# Patient Record
Sex: Female | Born: 1935 | Race: White | Hispanic: No | Smoking: Never smoker
Health system: Southern US, Community
[De-identification: ages and names within clinical notes are randomized; demographics above are authoritative.]

## PROBLEM LIST (undated history)

## (undated) DIAGNOSIS — E669 Obesity, unspecified: Secondary | ICD-10-CM

## (undated) DIAGNOSIS — I351 Nonrheumatic aortic (valve) insufficiency: Secondary | ICD-10-CM

## (undated) DIAGNOSIS — T50905A Adverse effect of unspecified drugs, medicaments and biological substances, initial encounter: Secondary | ICD-10-CM

## (undated) DIAGNOSIS — M109 Gout, unspecified: Secondary | ICD-10-CM

## (undated) DIAGNOSIS — I350 Nonrheumatic aortic (valve) stenosis: Secondary | ICD-10-CM

## (undated) DIAGNOSIS — I509 Heart failure, unspecified: Secondary | ICD-10-CM

## (undated) DIAGNOSIS — K635 Polyp of colon: Secondary | ICD-10-CM

## (undated) DIAGNOSIS — J4540 Moderate persistent asthma, uncomplicated: Secondary | ICD-10-CM

## (undated) DIAGNOSIS — J449 Chronic obstructive pulmonary disease, unspecified: Secondary | ICD-10-CM

## (undated) DIAGNOSIS — I10 Essential (primary) hypertension: Secondary | ICD-10-CM

## (undated) DIAGNOSIS — I4891 Unspecified atrial fibrillation: Secondary | ICD-10-CM

## (undated) DIAGNOSIS — I493 Ventricular premature depolarization: Secondary | ICD-10-CM

## (undated) DIAGNOSIS — J9610 Chronic respiratory failure, unspecified whether with hypoxia or hypercapnia: Secondary | ICD-10-CM

## (undated) DIAGNOSIS — D649 Anemia, unspecified: Secondary | ICD-10-CM

## (undated) DIAGNOSIS — F329 Major depressive disorder, single episode, unspecified: Secondary | ICD-10-CM

## (undated) DIAGNOSIS — G8929 Other chronic pain: Secondary | ICD-10-CM

## (undated) DIAGNOSIS — R269 Unspecified abnormalities of gait and mobility: Secondary | ICD-10-CM

## (undated) DIAGNOSIS — I5032 Chronic diastolic (congestive) heart failure: Secondary | ICD-10-CM

## (undated) DIAGNOSIS — K5730 Diverticulosis of large intestine without perforation or abscess without bleeding: Secondary | ICD-10-CM

## (undated) DIAGNOSIS — Z780 Asymptomatic menopausal state: Secondary | ICD-10-CM

## (undated) DIAGNOSIS — J90 Pleural effusion, not elsewhere classified: Secondary | ICD-10-CM

## (undated) DIAGNOSIS — M205X9 Other deformities of toe(s) (acquired), unspecified foot: Secondary | ICD-10-CM

## (undated) DIAGNOSIS — E782 Mixed hyperlipidemia: Secondary | ICD-10-CM

## (undated) DIAGNOSIS — F5101 Primary insomnia: Secondary | ICD-10-CM

## (undated) DIAGNOSIS — Z79899 Other long term (current) drug therapy: Secondary | ICD-10-CM

## (undated) DIAGNOSIS — J984 Other disorders of lung: Secondary | ICD-10-CM

## (undated) DIAGNOSIS — I1310 Hypertensive heart and chronic kidney disease without heart failure, with stage 1 through stage 4 chronic kidney disease, or unspecified chronic kidney disease: Secondary | ICD-10-CM

## (undated) DIAGNOSIS — Q2730 Arteriovenous malformation, site unspecified: Secondary | ICD-10-CM

## (undated) DIAGNOSIS — G4733 Obstructive sleep apnea (adult) (pediatric): Secondary | ICD-10-CM

## (undated) DIAGNOSIS — R0600 Dyspnea, unspecified: Secondary | ICD-10-CM

## (undated) DIAGNOSIS — M545 Low back pain: Secondary | ICD-10-CM

## (undated) DIAGNOSIS — J181 Lobar pneumonia, unspecified organism: Secondary | ICD-10-CM

## (undated) HISTORY — DX: Moderate persistent asthma, uncomplicated: J45.40

## (undated) HISTORY — DX: Heart failure, unspecified: I50.9

## (undated) HISTORY — DX: Other long term (current) drug therapy: Z79.899

## (undated) HISTORY — PX: TONSILLECTOMY: SUR1361

## (undated) HISTORY — DX: Chronic diastolic (congestive) heart failure: I50.32

## (undated) HISTORY — PX: VAGINAL HYSTERECTOMY: SUR661

## (undated) HISTORY — DX: Major depressive disorder, single episode, unspecified: F32.9

## (undated) HISTORY — DX: Gout, unspecified: M10.9

## (undated) HISTORY — DX: Other deformities of toe(s) (acquired), unspecified foot: M20.5X9

## (undated) HISTORY — DX: Mixed hyperlipidemia: E78.2

## (undated) HISTORY — DX: Hypomagnesemia: E83.42

## (undated) HISTORY — PX: OTHER SURGICAL HISTORY: SHX169

## (undated) HISTORY — DX: Unspecified atrial fibrillation: I48.91

## (undated) HISTORY — DX: Nonrheumatic aortic (valve) insufficiency: I35.1

## (undated) HISTORY — DX: Chronic respiratory failure, unspecified whether with hypoxia or hypercapnia: J96.10

## (undated) HISTORY — DX: Essential (primary) hypertension: I10

## (undated) HISTORY — DX: Other disorders of lung: J98.4

## (undated) HISTORY — DX: Diverticulosis of large intestine without perforation or abscess without bleeding: K57.30

## (undated) HISTORY — DX: Pleural effusion, not elsewhere classified: J90

## (undated) HISTORY — DX: Primary insomnia: F51.01

## (undated) HISTORY — DX: Low back pain: M54.5

## (undated) HISTORY — DX: Lobar pneumonia, unspecified organism: J18.1

## (undated) HISTORY — PX: CATARACT EXTRACTION: SUR2

## (undated) HISTORY — DX: Nonrheumatic aortic (valve) stenosis: I35.0

## (undated) HISTORY — DX: Obstructive sleep apnea (adult) (pediatric): G47.33

## (undated) HISTORY — DX: Other chronic pain: G89.29

## (undated) HISTORY — DX: Polyp of colon: K63.5

## (undated) HISTORY — PX: ABLATION: SHX5711

## (undated) HISTORY — DX: Adverse effect of unspecified drugs, medicaments and biological substances, initial encounter: T50.905A

## (undated) HISTORY — DX: Chronic obstructive pulmonary disease, unspecified: J44.9

## (undated) HISTORY — DX: Ventricular premature depolarization: I49.3

## (undated) HISTORY — DX: Unspecified abnormalities of gait and mobility: R26.9

## (undated) HISTORY — DX: Asymptomatic menopausal state: Z78.0

## (undated) HISTORY — DX: Arteriovenous malformation, site unspecified: Q27.30

## (undated) HISTORY — DX: Anemia, unspecified: D64.9

## (undated) HISTORY — DX: Dyspnea, unspecified: R06.00

## (undated) HISTORY — DX: Obesity, unspecified: E66.9

## (undated) HISTORY — DX: Hypertensive heart and chronic kidney disease without heart failure, with stage 1 through stage 4 chronic kidney disease, or unspecified chronic kidney disease: I13.10

---

## 2014-02-13 ENCOUNTER — Encounter: Payer: Self-pay | Admitting: Critical Care Medicine

## 2014-02-14 ENCOUNTER — Encounter: Payer: Self-pay | Admitting: Critical Care Medicine

## 2014-02-14 ENCOUNTER — Ambulatory Visit (INDEPENDENT_AMBULATORY_CARE_PROVIDER_SITE_OTHER): Payer: Self-pay | Admitting: Critical Care Medicine

## 2014-02-14 VITALS — BP 134/68 | HR 84 | Temp 98.7°F | Ht 64.0 in | Wt 245.0 lb

## 2014-02-14 DIAGNOSIS — J4540 Moderate persistent asthma, uncomplicated: Secondary | ICD-10-CM | POA: Insufficient documentation

## 2014-02-14 DIAGNOSIS — R0600 Dyspnea, unspecified: Secondary | ICD-10-CM

## 2014-02-14 DIAGNOSIS — I130 Hypertensive heart and chronic kidney disease with heart failure and stage 1 through stage 4 chronic kidney disease, or unspecified chronic kidney disease: Secondary | ICD-10-CM | POA: Insufficient documentation

## 2014-02-14 DIAGNOSIS — R0989 Other specified symptoms and signs involving the circulatory and respiratory systems: Secondary | ICD-10-CM

## 2014-02-14 DIAGNOSIS — M109 Gout, unspecified: Secondary | ICD-10-CM | POA: Insufficient documentation

## 2014-02-14 DIAGNOSIS — R0609 Other forms of dyspnea: Secondary | ICD-10-CM

## 2014-02-14 NOTE — Assessment & Plan Note (Signed)
Dyspnea on exertion with interstitial changes by report on CXR 08/2013 and 01/2014.  No other overt factors for interstitial fibrosis, No medication factors Plan Obtain full PFTs Obtain high resolution CT of chest  Obtain Allergy profile, ANA, RA, ESR. Finish course biaxin/prednisone F/u 3 weeks after above completed

## 2014-02-14 NOTE — Patient Instructions (Signed)
Full pulmonary function testing will be obtained A CT chest will be obtained  Finish biaxin and prednisone No other medication changes Return 3 weeks , after studies completed , we will call sooner with results

## 2014-02-14 NOTE — Progress Notes (Signed)
Subjective:    Patient ID: Sandy Hanson, female    DOB: October 21, 1932, 80 y.o.   MRN: [id number] Chief Complaint  Patient presents with  . Pulmonary Consult    Referred by Dr. Rosario Adie -  SOB with activity x 3 wks.  Cough with clear mucus  x few days.    HPI Comments: Dyspnea x 3 weeks. Dry cough. Pt noted progressive DOE going to [employer].  Ok at rest . Symptoms began 3 weeks ago.  08/2013, also had episode of dyspnea and dx bronchitis and Rx aBX   Shortness of Breath This is a new problem. The current episode started more than 1 month ago. The problem occurs daily (only with exertion, no QHS symptoms). The problem has been gradually improving. Associated symptoms include sputum production. Pertinent negatives include no abdominal pain, chest pain, claudication, coryza, ear pain, fever, headaches, hemoptysis, leg pain, leg swelling, orthopnea, PND, rash, rhinorrhea, sore throat, syncope, vomiting or wheezing. The symptoms are aggravated by any activity. Associated symptoms comments: Pt notes a dry cough, and some mucus is clear. Treatments tried: ABX : Biaxin, pred pulse 02/06/14.  The treatment provided significant relief. Her past medical history is significant for pneumonia. There is no history of allergies, aspirin allergies, asthma, bronchiolitis, CAD, chronic lung disease, COPD, DVT, a heart failure, PE or a recent surgery.    Past Medical History  Diagnosis Date  . Gout   . Hypertension   . Dyspnea      Family History  Problem Relation Age of Onset  . Heart disease Maternal Aunt   . Heart disease Paternal Aunt   . Lung cancer Father     non smoker     History   Social History  . Marital Status: N/A    Spouse Name: N/A    Number of Children: N/A  . Years of Education: N/A   Occupational History  . Retired     Pharmacist, hospital   Social History Main Topics  . Smoking status: Never Smoker   . Smokeless tobacco: Never Used  . Alcohol Use: Not on file  . Drug Use: Not on  file  . Sexual Activity: Not on file   Other Topics Concern  . Not on file   Social History Narrative  . No narrative on file     No Known Allergies   No outpatient prescriptions prior to visit.   No facility-administered medications prior to visit.      Review of Systems  Constitutional: Negative for fever.  HENT: Negative for ear pain, nosebleeds, postnasal drip, rhinorrhea, sinus pressure, sore throat, trouble swallowing and voice change.   Respiratory: Positive for cough, sputum production and shortness of breath. Negative for hemoptysis, choking, chest tightness and wheezing.   Cardiovascular: Negative for chest pain, orthopnea, claudication, leg swelling, syncope and PND.  Gastrointestinal: Negative.  Negative for vomiting and abdominal pain.  Skin: Negative for rash.  Neurological: Negative for headaches.       Objective:   Physical Exam Filed Vitals:   02/14/14 1601  BP: 134/68  Pulse: 84  Temp: 98.7 F (37.1 C)  TempSrc: Oral  Height: '5\' 4"'  (1.626 m)  Weight: 245 lb (111.131 kg)  SpO2: 91%    Gen: Pleasant, obese , in no distress,  normal affect  ENT: No lesions,  mouth clear,  oropharynx clear, no postnasal drip  Neck: No JVD, no TMG, no carotid bruits  Lungs: No use of accessory muscles, no dullness to percussion, exp  wheeze R mid lung zone.  Cardiovascular: RRR, heart sounds normal, no murmur or gallops, no peripheral edema  Abdomen: soft and NT, no HSM,  BS normal  Musculoskeletal: No deformities, no cyanosis or clubbing  Neuro: alert, non focal  Skin: Warm, no lesions or rashes  No results found.  amb saturations:89-92% with heavy ambulation  CXR from 08/2013 and 01/2014 reviewed     Assessment & Plan:   Dyspnea Dyspnea on exertion with interstitial changes by report on CXR 08/2013 and 01/2014.  No other overt factors for interstitial fibrosis, No medication factors Plan Obtain full PFTs Obtain high resolution CT of chest  Obtain  Allergy profile, ANA, RA, ESR. Finish course biaxin/prednisone F/u 3 weeks after above completed   Updated Medication List Outpatient Encounter Prescriptions as of 02/14/2014  Medication Sig  . allopurinol (ZYLOPRIM) 100 MG tablet Take 1 tablet by mouth daily.  Marland Kitchen amLODipine (NORVASC) 5 MG tablet Take 1 tablet by mouth daily.  . clarithromycin (BIAXIN) 500 MG tablet Take 1 tablet by mouth 2 (two) times daily.  . hydrochlorothiazide (HYDRODIURIL) 25 MG tablet Take 1 tablet by mouth daily.  . potassium chloride (K-DUR,KLOR-CON) 10 MEQ tablet Take 1 tablet by mouth daily.  . predniSONE (DELTASONE) 10 MG tablet take 6 x 4 days, 4  X 4 days, 2 x 4 days

## 2014-02-17 ENCOUNTER — Telehealth: Payer: Self-pay | Admitting: Critical Care Medicine

## 2014-02-17 LAB — PULMONARY FUNCTION TEST

## 2014-02-17 MED ORDER — AMOXICILLIN-POT CLAVULANATE 875-125 MG PO TABS: 1.0000 | ORAL_TABLET | Freq: Two times a day (BID) | ORAL | Status: DC

## 2014-02-17 NOTE — Telephone Encounter (Signed)
Pt returned call

## 2014-02-17 NOTE — Telephone Encounter (Signed)
Pt was seen by PW in [place] on 02/14/14. He would like to see her back in 3 wks. Pt was let go before appt was scheduled. lmomtcb for pt to schedule 3 wk follow up with PW in [place].

## 2014-02-17 NOTE — Telephone Encounter (Signed)
Per OV 02/14/14; Patient Instructions      Full pulmonary function testing will be obtained A CT chest will be obtained   Finish biaxin and prednisone No other medication changes Return 3 weeks , after studies completed , we will call sooner with results  ---  Called spoke with pt. She finished ABX. Pt c/o prod cough w/ clear-light green phlem. Since Wed she has been coughing a lot. She is blowing out clear phlem from nose. She had 1 night of wheezing but none since last OV w/ PW 01/1514. Denies any chest tx. Pt requesting recs. Please advise Dr. Joya Gaskins thanks.   No Known Allergies

## 2014-02-17 NOTE — Telephone Encounter (Signed)
Pt aware of recs. Rx sent in.  She scheduled appt in [place] for 03/28/14 at 10:45 d/t her going out of town for over 1 week. Will forward to [place] as an [place]

## 2014-02-17 NOTE — Telephone Encounter (Signed)
Call in augmentin 875mg  bid x 7 days

## 2014-02-17 NOTE — Telephone Encounter (Signed)
Appt scheduled. See phone msg from 6/19 for additional information.

## 2014-02-21 ENCOUNTER — Encounter: Payer: Self-pay | Admitting: Critical Care Medicine

## 2014-02-21 ENCOUNTER — Telehealth: Payer: Self-pay | Admitting: Critical Care Medicine

## 2014-02-21 DIAGNOSIS — R0600 Dyspnea, unspecified: Secondary | ICD-10-CM

## 2014-02-21 NOTE — Telephone Encounter (Signed)
Called, spoke with pt.  Informed her of lab results per Dr. Joya Gaskins.  She verbalized understanding.  Pt states she had PFT done at [facility] on Friday and is requesting results.    [facility], spoke with Medical Records.  They will fax results to [facility] office today.

## 2014-02-21 NOTE — Telephone Encounter (Signed)
Call pt and tell her all of her labs are normal No allergies, no autoimmune problem as a cause of lung problems

## 2014-02-21 NOTE — Telephone Encounter (Signed)
Breathing test shows severe obstruction and restriction I advise:  More prednisone 10mg  Take 4 for four days 3 for four days 2 for four days 1 for four days #40 Start symbicort two puff twice daily When will pt get CT Chest done??

## 2014-02-21 NOTE — Telephone Encounter (Signed)
PFT results from 02/17/14 received and placed in PW's folder. Dr. Joya Gaskins, pls advise.   Thank you.

## 2014-02-22 MED ORDER — BUDESONIDE-FORMOTEROL FUMARATE 160-4.5 MCG/ACT IN AERO: 2.0000 | INHALATION_SPRAY | Freq: Two times a day (BID) | RESPIRATORY_TRACT | Status: DC

## 2014-02-22 MED ORDER — PREDNISONE 10 MG PO TABS: ORAL_TABLET | ORAL | Status: DC

## 2014-02-22 NOTE — Telephone Encounter (Signed)
Called, spoke with pt.  Informed her of below PFT results and recs per Dr. Joya Gaskins.  She verbalized understanding and would like rxs sent to [facility].  This has been done -- pt is aware and is to call back if she has any problems or concerns.  Pt states she has not yet been scheduled for CT Chest.  This order was placed on 02/14/14 during [place].  Pt would like to have this done at [facility].  [facility], will you please schedule this and let pt know.  Also, please let me know so I can follow up on results.  Thank you.

## 2014-02-23 NOTE — Telephone Encounter (Signed)
appt 03/02/14@[place]  [facility] @2 :30pm pt is aware Joellen [place]

## 2014-03-01 ENCOUNTER — Telehealth: Payer: Self-pay | Admitting: Critical Care Medicine

## 2014-03-01 ENCOUNTER — Encounter: Payer: Self-pay | Admitting: Critical Care Medicine

## 2014-03-01 NOTE — Telephone Encounter (Signed)
Order printed and faxed to [facility] for CT scheduled 03/02/14 Faxed to (F) [phone] Spoke to [place] with [facility]-- verified received order  Nothing further needed.

## 2014-03-07 NOTE — Telephone Encounter (Signed)
Pt had CT Chest done at [facility] on 03/02/14. Results have been placed in PWs to do to address when he returns to office next wk. Pt aware and would like [place] to call [phone] next wk with results as she will be out of town. Will route to PW's box and to my box for follow up.

## 2014-03-12 NOTE — Telephone Encounter (Signed)
CT Chest was Completely NORMAL.  No pulmonary fibrosis seen

## 2014-03-13 NOTE — Telephone Encounter (Signed)
lmomtcb for pt 

## 2014-03-14 NOTE — Telephone Encounter (Signed)
lmomtcb for pt 

## 2014-03-15 ENCOUNTER — Telehealth: Payer: Self-pay | Admitting: Critical Care Medicine

## 2014-03-15 NOTE — Telephone Encounter (Signed)
Called and spoke to pt. Informed pt of results of CT chest per PW and to continue to follow PW's OV recs. Pt verbalized understanding and denied any further concerns at this time.

## 2014-03-15 NOTE — Telephone Encounter (Signed)
Called and spoke to pt. Informed pt of results of CT chest per PW and to continue to follow PW's OV recs. Pt verbalized understanding and denied any further concerns at this time.    Elsie Stain, MD at 03/12/2014 4:06 PM     Status: Signed        CT Chest was Completely NORMAL. No pulmonary fibrosis seen

## 2014-03-15 NOTE — Telephone Encounter (Signed)
lmomtcb for pt 

## 2014-03-20 ENCOUNTER — Telehealth: Payer: Self-pay | Admitting: Critical Care Medicine

## 2014-03-20 MED ORDER — AZITHROMYCIN 250 MG PO TABS: ORAL_TABLET | ORAL | Status: DC

## 2014-03-20 MED ORDER — PREDNISONE 10 MG PO TABS: ORAL_TABLET | ORAL | Status: DC

## 2014-03-20 NOTE — Telephone Encounter (Signed)
Pt returned call

## 2014-03-20 NOTE — Telephone Encounter (Signed)
lmtcb on number listed in message

## 2014-03-20 NOTE — Telephone Encounter (Signed)
Prednisone 10mg  and zpak have been sent to [facility]  Pt aware.  Nothing further needed.

## 2014-03-20 NOTE — Telephone Encounter (Signed)
Called, spoke with pt -  Is currently at [facility].  C/o cough with increased mucus production since Friday.  Mucus is clear, white, to yellow.  Has increased SOB in the evenings, some chest tightness, and a little wheezing.  No f/c/s.  Is taking symbicort bid.  Has not tried anything otc for symptoms.  Requesting further recs.  Dr. Joya Gaskins, pls advise. Thank you.  Has a pending OV with PW on July 28 in [place] [address] [facility] - Phone # above

## 2014-03-20 NOTE — Telephone Encounter (Signed)
She will need called in:    Prednisone 10mg  Take 4 tablets daily for 5 days then stop #20 Azithromycin 250mg  Take two once then one daily until gone #6  Keep 7/28 appt

## 2014-03-28 ENCOUNTER — Encounter: Payer: Self-pay | Admitting: Critical Care Medicine

## 2014-03-28 ENCOUNTER — Ambulatory Visit (INDEPENDENT_AMBULATORY_CARE_PROVIDER_SITE_OTHER): Payer: Medicare PPO | Admitting: Critical Care Medicine

## 2014-03-28 VITALS — BP 102/64 | HR 93 | Temp 98.3°F | Ht 64.0 in | Wt 243.2 lb

## 2014-03-28 DIAGNOSIS — J45901 Unspecified asthma with (acute) exacerbation: Secondary | ICD-10-CM

## 2014-03-28 DIAGNOSIS — J4541 Moderate persistent asthma with (acute) exacerbation: Secondary | ICD-10-CM

## 2014-03-28 DIAGNOSIS — R0600 Dyspnea, unspecified: Secondary | ICD-10-CM

## 2014-03-28 MED ORDER — COMPRESSOR/NEBULIZER MISC
Status: DC
Start: 2014-03-28 — End: 2017-04-21

## 2014-03-28 MED ORDER — METHYLPREDNISOLONE ACETATE 80 MG/ML IJ SUSP
120.0000 mg | Freq: Once | INTRAMUSCULAR | Status: AC
  Administered 2014-03-28: 120 mg via INTRAMUSCULAR

## 2014-03-28 MED ORDER — ALBUTEROL SULFATE (2.5 MG/3ML) 0.083% IN NEBU: 2.5000 mg | INHALATION_SOLUTION | Freq: Four times a day (QID) | RESPIRATORY_TRACT | Status: DC | PRN

## 2014-03-28 MED ORDER — FLUTICASONE PROPIONATE 50 MCG/ACT NA SUSP
2.0000 | Freq: Every day | NASAL | Status: DC
Start: 2014-03-28 — End: 2014-04-04

## 2014-03-28 NOTE — Progress Notes (Signed)
   Subjective:    Patient ID: Sandy Hanson, female    DOB: [birth year], 80 y.o.   MRN: [id number]  HPI Comments: Dyspnea x 3 weeks. Dry cough. Pt noted progressive DOE going to [employer].  Ok at rest . Symptoms began 3 weeks ago.  08/2013, also had episode of dyspnea and dx bronchitis and Rx aBX   03/28/2014 Chief Complaint  Patient presents with  . Follow-up    c/o sob worse x 2-3 wks. with exertion,wheezing, cough-clear,white yellow,green; midchest tightness in am occass.,staying inside b/c of humidity,no energy,hot a lot  Obtain full PFTs Obtain high resolution CT of chest  Obtain Allergy profile, ANA, RA, ESR. Finish course biaxin/prednisone  Pt on symbicort twice daily.          Review of Systems  HENT: Negative for nosebleeds, postnasal drip, sinus pressure, trouble swallowing and voice change.   Respiratory: Positive for cough. Negative for choking and chest tightness.   Gastrointestinal: Negative.        Objective:   Physical Exam  Filed Vitals:   03/28/14 1110  BP: 102/64  Pulse: 93  Temp: 98.3 F (36.8 C)  TempSrc: Oral  Height: $Remove'5\' 4"'[id number]$  (1.626 m)  Weight: 243 lb 3.2 oz (110.315 kg)  SpO2: 92%    Gen: Pleasant, obese , in no distress,  normal affect  ENT: No lesions,  mouth clear,  oropharynx clear, no postnasal drip  Neck: No JVD, no TMG, no carotid bruits  Lungs: No use of accessory muscles, no dullness to percussion, exp wheeze R mid lung zone.  Cardiovascular: RRR, heart sounds normal, no murmur or gallops, no peripheral edema  Abdomen: soft and NT, no HSM,  BS normal  Musculoskeletal: No deformities, no cyanosis or clubbing  Neuro: alert, non focal  Skin: Warm, no lesions or rashes  No results found.  amb saturations:89-92% with heavy ambulation  CXR from 08/2013 and 01/2014 reviewed     Assessment & Plan:   No problem-specific assessment & plan notes found for this encounter.   Updated Medication List Outpatient Encounter  Prescriptions as of 03/28/2014  Medication Sig  . allopurinol (ZYLOPRIM) 100 MG tablet Take 1 tablet by mouth daily.  Marland Kitchen amLODipine (NORVASC) 5 MG tablet Take 1 tablet by mouth daily.  . budesonide-formoterol (SYMBICORT) 160-4.5 MCG/ACT inhaler Inhale 2 puffs into the lungs 2 (two) times daily.  . hydrochlorothiazide (HYDRODIURIL) 25 MG tablet Take 1 tablet by mouth daily.  . potassium chloride (K-DUR,KLOR-CON) 10 MEQ tablet Take 1 tablet by mouth daily.  . [DISCONTINUED] amoxicillin-clavulanate (AUGMENTIN) 875-125 MG per tablet Take 1 tablet by mouth 2 (two) times daily.  . [DISCONTINUED] azithromycin (ZITHROMAX) 250 MG tablet Take 2 tabs (together) on day one, then one daily until gone.  . [DISCONTINUED] clarithromycin (BIAXIN) 500 MG tablet Take 1 tablet by mouth 2 (two) times daily.  . [DISCONTINUED] predniSONE (DELTASONE) 10 MG tablet take 6 x 4 days, 4  X 4 days, 2 x 4 days  . [DISCONTINUED] predniSONE (DELTASONE) 10 MG tablet Take 4 for four days 3 for four days 2 for four days 1 for four days  . [DISCONTINUED] predniSONE (DELTASONE) 10 MG tablet Take 4 tabs daily x 5 days

## 2014-03-28 NOTE — Patient Instructions (Signed)
Return one week in [place] office A Depomedrol injection was given 120mg   USe spacer with symbicort A nebulizer with albuterol was obtained Start fluticasone two sprays ea nostril daily

## 2014-03-28 NOTE — Assessment & Plan Note (Signed)
Moderate persistent asthma with significant lower airway obstruction Poor response to bronchodilator due to very poor HFA technique Plan A Depomedrol injection was given 120mg   USe spacer with symbicort A nebulizer with albuterol was obtained Start fluticasone two sprays ea nostril daily The patient was reinstructed as to proper HFA technique

## 2014-03-30 ENCOUNTER — Encounter: Payer: Self-pay | Admitting: Critical Care Medicine

## 2014-04-04 ENCOUNTER — Telehealth: Payer: Self-pay | Admitting: Critical Care Medicine

## 2014-04-04 ENCOUNTER — Encounter: Payer: Self-pay | Admitting: Critical Care Medicine

## 2014-04-04 ENCOUNTER — Ambulatory Visit (INDEPENDENT_AMBULATORY_CARE_PROVIDER_SITE_OTHER): Payer: Medicare PPO | Admitting: Critical Care Medicine

## 2014-04-04 VITALS — BP 118/68 | HR 120 | Temp 97.9°F | Ht 64.0 in | Wt 240.6 lb

## 2014-04-04 DIAGNOSIS — J4541 Moderate persistent asthma with (acute) exacerbation: Secondary | ICD-10-CM

## 2014-04-04 DIAGNOSIS — J019 Acute sinusitis, unspecified: Secondary | ICD-10-CM | POA: Insufficient documentation

## 2014-04-04 DIAGNOSIS — J45901 Unspecified asthma with (acute) exacerbation: Secondary | ICD-10-CM

## 2014-04-04 DIAGNOSIS — J0191 Acute recurrent sinusitis, unspecified: Secondary | ICD-10-CM

## 2014-04-04 MED ORDER — BUDESONIDE 0.25 MG/2ML IN SUSP: 0.2500 mg | Freq: Two times a day (BID) | RESPIRATORY_TRACT | Status: DC

## 2014-04-04 MED ORDER — ARFORMOTEROL TARTRATE 15 MCG/2ML IN NEBU: 15.0000 ug | INHALATION_SOLUTION | Freq: Two times a day (BID) | RESPIRATORY_TRACT | Status: DC

## 2014-04-04 MED ORDER — AMOXICILLIN-POT CLAVULANATE 875-125 MG PO TABS: 1.0000 | ORAL_TABLET | Freq: Two times a day (BID) | ORAL | Status: DC

## 2014-04-04 MED ORDER — FLUTICASONE PROPIONATE 50 MCG/ACT NA SUSP: 2.0000 | Freq: Two times a day (BID) | NASAL | Status: DC

## 2014-04-04 NOTE — Patient Instructions (Signed)
Start Brovana and budesonide in nebulizer twice daily Stop symbicort Increase flonase two puff ea nostril twice daily Take augmentin twice daily for 7days Albuterol in nebulizer as needed Use saline rinse in both nostrils twice daily Return 1 month

## 2014-04-04 NOTE — Progress Notes (Signed)
Subjective:    Patient ID: Sandy Hanson, female    DOB: October 18, 1932, 80 y.o.   MRN: [id number]  HPI 04/04/2014 Chief Complaint  Patient presents with  . Follow-up    still has sob but better now,blowing nose a lot-clear,cough-green,white,occass. wheezing,midchest tightness,,using neb. 1-2 day  FPt is still coughing up mucus.  Pt still feels like is choking. Blows out of nose .  Mucus is green , and occ foamy white No real sinus pressure.  The patient has a spacer device with the [facility] Symbicort however upon demonstration still has very poor overall technique    Review of Systems Constitutional:   No  weight loss, night sweats,  Fevers, chills, fatigue, lassitude. HEENT:   No headaches,  Difficulty swallowing,  Tooth/dental problems,  Sore throat,                No sneezing, itching, ear ache, ++nasal congestion, +++post nasal drip,   CV:  No chest pain,  Orthopnea, PND, swelling in lower extremities, anasarca, dizziness, palpitations  GI  No heartburn, indigestion, abdominal pain, nausea, vomiting, diarrhea, change in bowel habits, loss of appetite  Resp: Notes shortness of breath with exertion and at rest.  Notes  excess mucus, notes  productive cough,  No non-productive cough,  No coughing up of blood.  Notes  change in color of mucus.  No wheezing.  No chest wall deformity  Skin: no rash or lesions.  GU: no dysuria, change in color of urine, no urgency or frequency.  No flank pain.  MS:  No joint pain or swelling.  No decreased range of motion.  No back pain.  Psych:  No change in mood or affect. No depression or anxiety.  No memory loss.     Objective:   Physical Exam Filed Vitals:   04/04/14 1059  BP: 118/68  Pulse: 120  Temp: 97.9 F (36.6 C)  TempSrc: Oral  Height: 5\' 4"  (1.626 m)  Weight: 240 lb 9.6 oz (109.135 kg)  SpO2: 93%    Gen: Pleasant, well-nourished, in no distress,  normal affect  ENT: No lesions,  mouth clear,  oropharynx clear, purulent right  nares  Neck: No JVD, no TMG, no carotid bruits  Lungs: No use of accessory muscles, no dullness to percussion, inspiratory expiratory wheezes with poor airflow  Cardiovascular: RRR, heart sounds normal, no murmur or gallops, no peripheral edema  Abdomen: soft and NT, no HSM,  BS normal  Musculoskeletal: No deformities, no cyanosis or clubbing  Neuro: alert, non focal  Skin: Warm, no lesions or rashes  No results found.      Assessment & Plan:   Asthma, moderate persistent Moderate persistent asthma with very poor HFA technique even with spacer device Ongoing symptom complex due to inability to deliver medication the lungs because of technique issue Mild acute sinusitis Plan Start Brovana and budesonide in nebulizer twice daily Stop symbicort Increase flonase two puff ea nostril twice daily Take augmentin twice daily for 7days Albuterol in nebulizer as needed Use saline rinse in both nostrils twice daily Return 1 month    Updated Medication List Outpatient Encounter Prescriptions as of 04/04/2014  Medication Sig  . albuterol (PROVENTIL) (2.5 MG/3ML) 0.083% nebulizer solution Take 3 mLs (2.5 mg total) by nebulization every 6 (six) hours as needed for wheezing or shortness of breath.  . allopurinol (ZYLOPRIM) 100 MG tablet Take 1 tablet by mouth daily.  Marland Kitchen amLODipine (NORVASC) 5 MG tablet Take 1 tablet by mouth daily.  Marland Kitchen  fluticasone (FLONASE) 50 MCG/ACT nasal spray Place 2 sprays into both nostrils 2 (two) times daily.  . hydrochlorothiazide (HYDRODIURIL) 25 MG tablet Take 1 tablet by mouth daily.  . Nebulizers (COMPRESSOR/NEBULIZER) MISC Use with albuterol  . potassium chloride (K-DUR,KLOR-CON) 10 MEQ tablet Take 1 tablet by mouth daily.  . [DISCONTINUED] budesonide-formoterol (SYMBICORT) 160-4.5 MCG/ACT inhaler Inhale 2 puffs into the lungs 2 (two) times daily.  . [DISCONTINUED] fluticasone (FLONASE) 50 MCG/ACT nasal spray Place 2 sprays into both nostrils daily.  Marland Kitchen  amoxicillin-clavulanate (AUGMENTIN) 875-125 MG per tablet Take 1 tablet by mouth 2 (two) times daily.  Marland Kitchen arformoterol (BROVANA) 15 MCG/2ML NEBU Take 2 mLs (15 mcg total) by nebulization 2 (two) times daily.  . budesonide (PULMICORT) 0.25 MG/2ML nebulizer solution Take 2 mLs (0.25 mg total) by nebulization 2 (two) times daily.

## 2014-04-04 NOTE — Telephone Encounter (Signed)
Pt was started on Brovana and budesonide nebs today during OV.  Called [facility] in [place], spoke with Rowley. Was advised because pt has HMO [employer], she is one of the few neb meds will need to go through PART D NOT PART B.   Budesonide is going through insurance. Garlon Hatchet is needing a PA through PART D. Ulice Dash will fax this form to triage.  Pt has tried albuterol neb - did not help. She is not capable of using any HFA device.   Will await fax to initiate PA.

## 2014-04-04 NOTE — Assessment & Plan Note (Signed)
Moderate persistent asthma with very poor HFA technique even with spacer device Ongoing symptom complex due to inability to deliver medication the lungs because of technique issue Mild acute sinusitis Plan Start Brovana and budesonide in nebulizer twice daily Stop symbicort Increase flonase two puff ea nostril twice daily Take augmentin twice daily for 7days Albuterol in nebulizer as needed Use saline rinse in both nostrils twice daily Return 1 month

## 2014-04-05 NOTE — Telephone Encounter (Signed)
Seen by PW in [place] 04/04/14--started on Brovana with Budesonide in Nebulizer Took nebulizers today at 10:30 --  Mixed the two together -- shortly after taking nebulizers pt states she became very nauseated. Pt states that she has been throwing up since 12pm today--pt states that she feel very sick, having a lot of dry heaving and yellow bile when vomiting.  Pt wanting to know if mixing the two medications above could have caused her current symptoms.  No drug allergies that she is aware of. No Known Allergies  Pt aware that since it is late in the day that we are going to ask our Doc of the Day Dr Gwenette Greet for recommendations.  Please advise Dr Gwenette Greet. Thanks.

## 2014-04-05 NOTE — Telephone Encounter (Signed)
Pt is aware of below. Please advise PW thanks

## 2014-04-05 NOTE — Telephone Encounter (Signed)
I am not aware of any reaction that would keep her from using the two together.  Nevertheless, she should avoid mixing them until we can send a message to Dr. Joya Gaskins and get his input about mixing. Please forward this to him.

## 2014-04-05 NOTE — Telephone Encounter (Signed)
Patient states she is been throwing up.  She thinks this is due to medications.  Asking to speak to nurse.

## 2014-04-06 NOTE — Telephone Encounter (Signed)
Pt states that she took her Budesonide and Brovana together last night and had NO nausea/vomiting Pt states that she has given it some thought and states that she remembers taking her Augmentin on a fairly empty stomach yesterday AM is almost convinced that this is what caused her GI upset. Advised the patient of rec's per Dr Joya Gaskins below and that patient expressed understanding stating that she may try to continue taking the nebs together seeing that she did not have any GI issues. Advised the patient to contact our office if anything further needed.

## 2014-04-06 NOTE — Telephone Encounter (Signed)
Try HOLDing budesonide for now Use brovana ALONE bid If no nausea then in the future add budesonide back but DO NOT MIX Meds again

## 2014-04-07 ENCOUNTER — Telehealth: Payer: Self-pay | Admitting: *Deleted

## 2014-04-07 NOTE — Telephone Encounter (Signed)
Called [phone] Had to use the pts SS# since her insurance info was not scanned into her chart.   Waiting on fax to be sent to triage fax #.

## 2014-04-07 NOTE — Telephone Encounter (Signed)
Form received and placed in PW look at to sign.  Placed on crystals desk to have PW sign this.  Will forward to crystal to follow up on.

## 2014-04-07 NOTE — Telephone Encounter (Signed)
See phone msg from 04/07/14 for additional information regarding Brovana PA.

## 2014-04-07 NOTE — Telephone Encounter (Signed)
Garlon Hatchet PA signed by Dr. Joya Gaskins and faxed to [facility] at [phone].  Will await approval/denial.

## 2014-04-13 ENCOUNTER — Ambulatory Visit (INDEPENDENT_AMBULATORY_CARE_PROVIDER_SITE_OTHER): Payer: Medicare PPO | Admitting: Adult Health

## 2014-04-13 ENCOUNTER — Telehealth: Payer: Self-pay | Admitting: Critical Care Medicine

## 2014-04-13 ENCOUNTER — Encounter: Payer: Self-pay | Admitting: Adult Health

## 2014-04-13 VITALS — BP 120/66 | HR 68 | Temp 98.5°F | Ht 64.0 in | Wt 241.4 lb

## 2014-04-13 DIAGNOSIS — J45901 Unspecified asthma with (acute) exacerbation: Secondary | ICD-10-CM

## 2014-04-13 DIAGNOSIS — I10 Essential (primary) hypertension: Secondary | ICD-10-CM

## 2014-04-13 DIAGNOSIS — T50905A Adverse effect of unspecified drugs, medicaments and biological substances, initial encounter: Secondary | ICD-10-CM

## 2014-04-13 DIAGNOSIS — J4541 Moderate persistent asthma with (acute) exacerbation: Secondary | ICD-10-CM

## 2014-04-13 HISTORY — DX: Adverse effect of unspecified drugs, medicaments and biological substances, initial encounter: T50.905A

## 2014-04-13 NOTE — Telephone Encounter (Signed)
i called [employer] and they did fax over form stating that part d has denied coverage but part b will cover this medication.  [facility] is out of stock and this will be back in tomorrow.  They will run this through for the pt tomorrow.  Will sign off of this message.

## 2014-04-13 NOTE — Assessment & Plan Note (Signed)
Recent flare now resolving  Mild allergic reaction to PCN -appearing to be resolving  Plan  Zyrtec 10mg  daily for 5 days -at bedtime  Pepcid 20mg  daily for 5 days -at bedtime  Continue on Brovana and Pulmicort neb  Please contact office for sooner follow up if symptoms do not improve or worsen or seek emergency care  follow up Dr. Joya Gaskins  As planned and As needed

## 2014-04-13 NOTE — Telephone Encounter (Signed)
Called spoke with pt. appt scheduled to see TP today. Nothing further needed 

## 2014-04-13 NOTE — Progress Notes (Signed)
   Subjective:    Patient ID: Sandy Hanson, female    DOB: 23-Mar-1933, 80 y.o.   MRN: [id number]  HPI 80 yo female with known hx of moderate persistent asthma   04/13/2014 Acute OV  Complains of possible med reaction with hives appearing yesterday morning, itching. Hives along torso, arms, legs  Finished Augmentin 8/11 PM, took nebs 8/12 AM, flonase this AM.  Has never had a PCN reaction in past.  Denies any increased SOB, wheezing, tongue swelling, difficulty swallowing. Has pruritis on/off.  No chest pain, orthopnea, edema .  Cough, congestion and wheezing are much better.       Review of Systems Constitutional:   No  weight loss, night sweats,  Fevers, chills,  +fatigue, or  lassitude.  HEENT:   No headaches,  Difficulty swallowing,  Tooth/dental problems, or  Sore throat,                No sneezing, itching, ear ache, nasal congestion, post nasal drip,   CV:  No chest pain,  Orthopnea, PND, swelling in lower extremities, anasarca, dizziness, palpitations, syncope.   GI  No heartburn, indigestion, abdominal pain, nausea, vomiting, diarrhea, change in bowel habits, loss of appetite, bloody stools.   Resp:    No chest wall deformity  Skin: +hives.  GU: no dysuria, change in color of urine, no urgency or frequency.  No flank pain, no hematuria   MS:  No joint pain or swelling.  No decreased range of motion.  No back pain.  Psych:  No change in mood or affect. No depression or anxiety.  No memory loss.         Objective:   Physical Exam GEN: A/Ox3; pleasant , NAD, obese , elderly   HEENT:  [place]/AT,  EACs-clear, TMs-wnl, NOSE-clear, THROAT-clear, no lesions, no postnasal drip or exudate noted.   NECK:  Supple w/ fair ROM; no JVD; normal carotid impulses w/o bruits; no thyromegaly or nodules palpated; no lymphadenopathy.  RESP  Clear  P & A; w/o, wheezes/ rales/ or rhonchi.no accessory muscle use, no dullness to percussion  CARD:  RRR, no m/r/g  , no peripheral  edema, pulses intact, no cyanosis or clubbing.  GI:   Soft & nt; nml bowel sounds; no organomegaly or masses detected.  Musco: Warm bil, no deformities or joint swelling noted.   Neuro: alert, no focal deficits noted.    Skin: Warm, few scattered hives along arms and posterior neck .         Assessment & Plan:

## 2014-04-13 NOTE — Patient Instructions (Signed)
Zyrtec 10mg  daily for 5 days -at bedtime  Pepcid 20mg  daily for 5 days -at bedtime  Cool compresses to areas  Avoid excessive sunlight and hot showers until resolved.  Please contact office for sooner follow up if symptoms do not improve or worsen or seek emergency care  List PCN as allergy .  follow up Dr. Joya Gaskins  As planned and As needed

## 2014-04-13 NOTE — Assessment & Plan Note (Signed)
Hives suspected PCN reaction =appears to be mild and resolving  Hold on steroids   Plan  Zyrtec 10mg  daily for 5 days -at bedtime  Pepcid 20mg  daily for 5 days -at bedtime  Cool compresses to areas  Avoid excessive sunlight and hot showers until resolved.  Please contact office for sooner follow up if symptoms do not improve or worsen or seek emergency care  List PCN as allergy .  follow up Dr. Joya Gaskins  As planned and As needed

## 2014-05-09 ENCOUNTER — Encounter: Payer: Self-pay | Admitting: Critical Care Medicine

## 2014-05-09 ENCOUNTER — Ambulatory Visit (INDEPENDENT_AMBULATORY_CARE_PROVIDER_SITE_OTHER): Payer: Medicare PPO | Admitting: Critical Care Medicine

## 2014-05-09 VITALS — BP 118/70 | HR 66 | Temp 97.1°F | Ht 64.0 in | Wt 243.0 lb

## 2014-05-09 DIAGNOSIS — J45909 Unspecified asthma, uncomplicated: Secondary | ICD-10-CM

## 2014-05-09 DIAGNOSIS — J4540 Moderate persistent asthma, uncomplicated: Secondary | ICD-10-CM

## 2014-05-09 NOTE — Progress Notes (Signed)
Subjective:    Patient ID: Sandy Hanson, female    DOB: 06/20/1933, 80 y.o.   MRN: [id number]  HPI  80 y.o.F  female with known hx of moderate persistent asthma   05/09/2014 Chief Complaint  Patient presents with 80  . Follow-up    sob same still gets out of breath easy,rare wheezing,cough-creamy touch of green sm. amt,denies cp or tightness,no fcs,Cardiologist wants to do Echo again   At last ov rx augmentin but pt developed rash Pt never had pcn allergy.   Pt is helped with brovana/budesonide Pt still has some wheezing.  Dyspnea with exertion.  Mucus is creamy, but less than before.  Needs echo per cardiology.   Review of Systems  Constitutional:   No  weight loss, night sweats,  Fevers, chills,  +fatigue, or  lassitude.  HEENT:   No headaches,  Difficulty swallowing,  Tooth/dental problems, or  Sore throat,                No sneezing, itching, ear ache, nasal congestion, post nasal drip,   CV:  No chest pain,  Orthopnea, PND, swelling in lower extremities, anasarca, dizziness, palpitations, syncope.   GI  No heartburn, indigestion, abdominal pain, nausea, vomiting, diarrhea, change in bowel habits, loss of appetite, bloody stools.   Resp:    No chest wall deformity  Skin: +hives.  GU: no dysuria, change in color of urine, no urgency or frequency.  No flank pain, no hematuria   MS:  No joint pain or swelling.  No decreased range of motion.  No back pain.  Psych:  No change in mood or affect. No depression or anxiety.  No memory loss.      Objective:   Physical Exam BP 118/70  Pulse 66  Temp(Src) 97.1 F (36.2 C) (Oral)  Ht 5\' 4"  (1.626 m)  Wt 243 lb (110.224 kg)  BMI 41.69 kg/m2  SpO2 91%  GEN: A/Ox3; pleasant , NAD, obese , elderly   HEENT:  [place]/AT,  EACs-clear, TMs-wnl, NOSE-clear, THROAT-clear, no lesions, no postnasal drip or exudate noted.   NECK:  Supple w/ fair ROM; no JVD; normal carotid impulses w/o bruits; no thyromegaly or nodules palpated; no  lymphadenopathy.  RESP  Clear  P & A; w/o, wheezes/ rales/ or rhonchi.no accessory muscle use, no dullness to percussion  CARD:  RRR, no m/r/g  , no peripheral edema, pulses intact, no cyanosis or clubbing.  GI:   Soft & nt; nml bowel sounds; no organomegaly or masses detected.  Musco: Warm bil, no deformities or joint swelling noted.   Neuro: alert, no focal deficits noted.    Skin: Warm, few scattered hives along arms and posterior neck .      Assessment & Plan:   Asthma, moderate persistent Moderate persistent asthma improved with neb meds.  May be getting a reaction to combining budesonide/brovana Having payment issues with insurance and her neb machine Plan Pt advised to separate budesonide from brovana.  Use brovana first then budesonide second No other medication changes Use albuterol as needed   Updated Medication List Outpatient Encounter Prescriptions as of 05/09/2014  Medication Sig  . albuterol (PROVENTIL) (2.5 MG/3ML) 0.083% nebulizer solution Take 3 mLs (2.5 mg total) by nebulization every 6 (six) hours as needed for wheezing or shortness of breath.  . allopurinol (ZYLOPRIM) 100 MG tablet Take 1 tablet by mouth daily.  Marland Kitchen amLODipine (NORVASC) 5 MG tablet Take 1 tablet by mouth daily.  Marland Kitchen arformoterol (BROVANA) 15 MCG/2ML  NEBU Take 2 mLs (15 mcg total) by nebulization 2 (two) times daily.  . budesonide (PULMICORT) 0.25 MG/2ML nebulizer solution Take 2 mLs (0.25 mg total) by nebulization 2 (two) times daily.  . hydrochlorothiazide (HYDRODIURIL) 25 MG tablet Take 1 tablet by mouth daily.  . Nebulizers (COMPRESSOR/NEBULIZER) MISC Use with albuterol  . potassium chloride (K-DUR,KLOR-CON) 10 MEQ tablet Take 1 tablet by mouth daily.  . fluticasone (FLONASE) 50 MCG/ACT nasal spray Place 2 sprays into both nostrils 2 (two) times daily.

## 2014-05-09 NOTE — Assessment & Plan Note (Signed)
Moderate persistent asthma improved with neb meds.  May be getting a reaction to combining budesonide/brovana Having payment issues with insurance and her neb machine Plan Pt advised to separate budesonide from brovana.  Use brovana first then budesonide second No other medication changes Use albuterol as needed

## 2014-05-09 NOTE — Patient Instructions (Addendum)
Try separating budesonide from brovana.  Use brovana first then budesonide second No other medication changes Use albuterol as needed We will check and dispute the nebulizer coverage issue Return 3 months

## 2014-07-03 ENCOUNTER — Telehealth: Payer: Self-pay | Admitting: Critical Care Medicine

## 2014-07-03 NOTE — Telephone Encounter (Signed)
Called, spoke with pt -  Discussed below recs per PW.  She verbalized understanding and is in agreement to STOP brovana to start on the Sotalol.  Pt aware I will contact pharm to advise of recs as well.  She verbalized understanding and voiced no further questions or concerns at this time.  Called [facility] in [place], spoke with Lavell Luster, Software engineer.  Discussed below with him.  He verbalized understanding of recs per PW and voiced no further questions or concerns at this time.

## 2014-07-03 NOTE — Telephone Encounter (Signed)
She will have to STOP brovana Stay on budesonide , use albuterol as needed in nebulizer

## 2014-07-03 NOTE — Telephone Encounter (Signed)
Called and spoke pt. Pt stated she went to cardiologist, Dr. Minna Merritts,  and found out she has a-fib and is scheduled for a cardioversion on 11/12. Pt was prescribed Sotalol 80mg  BID. Pt went to pick up medication and was told by pharmacist that if pt were to take the Sotalol WITH Brovana then it could cause sudden cardiac arrest. Pharmacist refused to give pt the medication while she is still on Brovana. Pt is requesting recs.   PW please advise.  Allergies  Allergen Reactions  . Augmentin [Amoxicillin-Pot Clavulanate] Hives and Itching

## 2014-08-15 ENCOUNTER — Telehealth: Payer: Self-pay

## 2014-08-15 NOTE — Telephone Encounter (Signed)
Pt needs appointment with PW in January in [place]. Can she be worked in anywhere? Thanks!

## 2014-08-18 NOTE — Telephone Encounter (Signed)
Dr. Bettina Gavia first available [place] appt is in Feb 2016.  Called, spoke with pt.  Offered appt sooner at [facility] office -- pt would like to do this.  Offered Dec appt at [facility].  Pt cannot do this and would like to schedule in Jan 2016.  Appt scheduled for Sep 25, 2014 at 10:45 am at [facility].  Pt confirmed appt date, time, and location and voiced no further questions or concerns at this time.

## 2014-09-25 ENCOUNTER — Ambulatory Visit: Payer: Medicare PPO | Admitting: Critical Care Medicine

## 2014-10-09 ENCOUNTER — Other Ambulatory Visit: Payer: Self-pay | Admitting: Critical Care Medicine

## 2014-10-09 ENCOUNTER — Ambulatory Visit: Payer: Medicare PPO | Admitting: Critical Care Medicine

## 2014-10-23 ENCOUNTER — Encounter: Payer: Self-pay | Admitting: Critical Care Medicine

## 2014-10-23 ENCOUNTER — Ambulatory Visit (INDEPENDENT_AMBULATORY_CARE_PROVIDER_SITE_OTHER): Payer: Medicare PPO | Admitting: Critical Care Medicine

## 2014-10-23 VITALS — BP 100/72 | HR 61 | Temp 98.6°F | Ht 64.0 in | Wt 247.2 lb

## 2014-10-23 DIAGNOSIS — J4540 Moderate persistent asthma, uncomplicated: Secondary | ICD-10-CM

## 2014-10-23 DIAGNOSIS — J449 Chronic obstructive pulmonary disease, unspecified: Secondary | ICD-10-CM

## 2014-10-23 DIAGNOSIS — I4891 Unspecified atrial fibrillation: Secondary | ICD-10-CM

## 2014-10-23 DIAGNOSIS — I480 Paroxysmal atrial fibrillation: Secondary | ICD-10-CM | POA: Insufficient documentation

## 2014-10-23 HISTORY — DX: Unspecified atrial fibrillation: I48.91

## 2014-10-23 NOTE — Assessment & Plan Note (Signed)
Severe persistent asthma with fixed airway obstruction Plan No change in inhaled or maintenance medications. Return in  4 months pulm rehab referral

## 2014-10-23 NOTE — Patient Instructions (Signed)
No change in medications. Return in         4 months [place] rehab referral was made

## 2014-10-23 NOTE — Progress Notes (Signed)
Subjective:    Patient ID: Sandy Hanson, female    DOB: 1932/12/16, 81 y.o.   MRN: [id number]  HPI 81 y.o.F  female with known hx of moderate persistent asthma    10/23/2014 Chief Complaint  Patient presents with  . Follow-up    Patient says she feels a lot better, but she is still not as good as she'd like to be.  SOB.  DOE.  Energy has improved since Cardioinversion.     Since last ov had DCCV.  On brovana/budesonide, asked to separate meds at last ov. This has helped the dyspnea and energy. Pt ok at home , gives out if moves to grocery area. No chest pain.  No wheezing.  Now off brovana d/t cardiac issues.  Still on budesonide  Pt had Afib and 07/13/14.  HR is better.     Review of Systems Constitutional:   No  weight loss, night sweats,  Fevers, chills,  +fatigue, or  lassitude.  HEENT:   No headaches,  Difficulty swallowing,  Tooth/dental problems, or  Sore throat,                No sneezing, itching, ear ache, nasal congestion, post nasal drip,   CV:  No chest pain,  Orthopnea, PND, swelling in lower extremities, anasarca, dizziness, palpitations, syncope.   GI  No heartburn, indigestion, abdominal pain, nausea, vomiting, diarrhea, change in bowel habits, loss of appetite, bloody stools.   Resp:    No chest wall deformity  Skin: +hives.  GU: no dysuria, change in color of urine, no urgency or frequency.  No flank pain, no hematuria   MS:  No joint pain or swelling.  No decreased range of motion.  No back pain.  Psych:  No change in mood or affect. No depression or anxiety.  No memory loss.      Objective:   Physical ExamBP 100/72 mmHg  Pulse 61  Temp(Src) 98.6 F (37 C) (Oral)  Ht 5\' 4"  (1.626 m)  Wt 247 lb 3.2 oz (112.129 kg)  BMI 42.41 kg/m2  SpO2 95%  GEN: A/Ox3; pleasant , NAD, obese , elderly   HEENT:  [place]/AT,  EACs-clear, TMs-wnl, NOSE-clear, THROAT-clear, no lesions, no postnasal drip or exudate noted.   NECK:  Supple w/ fair ROM; no JVD;  normal carotid impulses w/o bruits; no thyromegaly or nodules palpated; no lymphadenopathy.  RESP  Clear  P & A; w/o, wheezes/ rales/ or rhonchi.no accessory muscle use, no dullness to percussion  CARD:  RRR, no m/r/g  , no peripheral edema, pulses intact, no cyanosis or clubbing.  GI:   Soft & nt; nml bowel sounds; no organomegaly or masses detected.  Musco: Warm bil, no deformities or joint swelling noted.   Neuro: alert, no focal deficits noted.    Skin: Warm, few scattered hives along arms and posterior neck .      Assessment & Plan:   Asthma, moderate persistent Severe persistent asthma with fixed airway obstruction Plan No change in inhaled or maintenance medications. Return in  4 months pulm rehab referral     Updated Medication List Outpatient Encounter Prescriptions as of 10/23/2014  Medication Sig  . albuterol (PROVENTIL) (2.5 MG/3ML) 0.083% nebulizer solution Take 3 mLs (2.5 mg total) by nebulization every 6 (six) hours as needed for wheezing or shortness of breath.  . allopurinol (ZYLOPRIM) 100 MG tablet Take 1 tablet by mouth daily.  Marland Kitchen amLODipine (NORVASC) 5 MG tablet Take 1 tablet by mouth daily.  Marland Kitchen  budesonide (PULMICORT) 0.25 MG/2ML nebulizer solution INHALE THE CONTENTS OF ONE VIAL IN NEBULIZER TWICE DAILY  . CARTIA XT 240 MG 24 hr capsule   . ELIQUIS 5 MG TABS tablet   . fluticasone (FLONASE) 50 MCG/ACT nasal spray Place 2 sprays into both nostrils 2 (two) times daily.  . hydrochlorothiazide (HYDRODIURIL) 25 MG tablet Take 1 tablet by mouth daily.  . Nebulizers (COMPRESSOR/NEBULIZER) MISC Use with albuterol  . potassium chloride (K-DUR,KLOR-CON) 10 MEQ tablet Take 1 tablet by mouth daily.  . sotalol (BETAPACE) 80 MG tablet Take 80 mg by mouth 2 (two) times daily.

## 2014-10-31 ENCOUNTER — Telehealth: Payer: Self-pay | Admitting: Critical Care Medicine

## 2014-10-31 NOTE — Telephone Encounter (Signed)
Recent OV notes and PFT faxed to [facility] at [facility] at [place] aware these have been faxed.  Nothing further needed.

## 2014-12-04 ENCOUNTER — Telehealth: Payer: Self-pay | Admitting: Critical Care Medicine

## 2014-12-04 DIAGNOSIS — J4550 Severe persistent asthma, uncomplicated: Secondary | ICD-10-CM

## 2014-12-04 DIAGNOSIS — R0600 Dyspnea, unspecified: Secondary | ICD-10-CM

## 2014-12-04 NOTE — Telephone Encounter (Signed)
Daughter states the patient is getting worse-has days she no energy and breathing gets bad-neb tx's help slightly. Referral for pulmonary to [facility] is what patient and the daughter is wanting. Daughter is aware that I will send message to PW to address in the morning. Daughter wonders if patient should come in to see PW soon or go ahead and get in with [facility].   PW please advise.

## 2014-12-04 NOTE — Telephone Encounter (Signed)
i am ok with referral to asthma/allergy center at [facility] with sooner ROV

## 2014-12-04 NOTE — Telephone Encounter (Signed)
LM for daughter to return call

## 2014-12-05 NOTE — Telephone Encounter (Signed)
Referral has been placed. Nothing further needed.   

## 2014-12-05 NOTE — Telephone Encounter (Signed)
Refer to [place] pulmonary [facility] dr Cresenciano Lick Dx is severe persistent asthma/ dyspnea.

## 2014-12-05 NOTE — Telephone Encounter (Signed)
Spoke with pt's daughter, she is ok with the [facility] referral.   Dr. Joya Gaskins, before I place the referral to [facility], what is her diagnosis for this referral, and do you request a specific provider?  Thanks!

## 2014-12-11 ENCOUNTER — Telehealth: Payer: Self-pay | Admitting: Critical Care Medicine

## 2014-12-11 NOTE — Telephone Encounter (Signed)
Spoke with pt's daughter. She has been scheduled to see TP tomorrow morning at 9am to assess her breathing. Nothing further was needed.

## 2014-12-11 NOTE — Telephone Encounter (Signed)
Pt daughter returning call.Sandy Hanson ° °

## 2014-12-11 NOTE — Telephone Encounter (Signed)
Left message for Sandy Hanson to call back. 

## 2014-12-12 ENCOUNTER — Ambulatory Visit (INDEPENDENT_AMBULATORY_CARE_PROVIDER_SITE_OTHER): Payer: Medicare PPO | Admitting: Adult Health

## 2014-12-12 ENCOUNTER — Encounter: Payer: Self-pay | Admitting: Adult Health

## 2014-12-12 VITALS — BP 100/58 | HR 55 | Temp 98.1°F | Ht 64.0 in | Wt 257.2 lb

## 2014-12-12 DIAGNOSIS — I5021 Acute systolic (congestive) heart failure: Secondary | ICD-10-CM | POA: Diagnosis not present

## 2014-12-12 DIAGNOSIS — I509 Heart failure, unspecified: Secondary | ICD-10-CM | POA: Diagnosis not present

## 2014-12-12 DIAGNOSIS — J4540 Moderate persistent asthma, uncomplicated: Secondary | ICD-10-CM

## 2014-12-12 HISTORY — DX: Heart failure, unspecified: I50.9

## 2014-12-12 NOTE — Assessment & Plan Note (Signed)
Severe Asthma which appears controlled without obvious flare (no wheezing /cough/fever)  Continue on pulmicort  follow up Dr. Joya Gaskins  In 4 weeks .

## 2014-12-12 NOTE — Progress Notes (Signed)
   Subjective:    Patient ID: Sandy Hanson, female    DOB: [birth year], 80 y.o.   MRN: [id number]  HPI 81 yo female never smoker with moderate persistent asthma   12/12/2014 Acute OV  Complains of increased leg swelling, dyspnea, orthopnea, over last 4 weeks.  Shortness of breathing and DOE is getting progressively worse.  She is at the point she can not walk but short distances without wearing out.  She has Atrial Fib w/ cardioversion 07/2014 .  Weight is up 15 lb over last 6 months.  Legs more for swollen up to her knees.  Seen by cardiology 2 weeks ago , given lasix x 3 days with her HCTZ.  Has not helped at all.  She has a loop recorder .  Has ov with cards next week.  Pt denies wheezing , cough , fever or discolored mucus .  She is on pulmicort neb Twice daily  For her asthma She has signifciant airflow obstruction on PFT 2015 with post BD FEV1 44% , sign BD response c/w with asthma dx.  She denies chest pain, palpitation or syncope.  Previously on brovana but off since Nov (contraindication with sotalol. )      Review of Systems Constitutional:   No  weight loss, night sweats,  Fevers, chills, + fatigue, or  lassitude.  HEENT:   No headaches,  Difficulty swallowing,  Tooth/dental problems, or  Sore throat,                No sneezing, itching, ear ache, nasal congestion, post nasal drip,   CV:  No chest pain,  Orthopnea, PND, ++++swelling in lower extremities,  No anasarca, dizziness, palpitations, syncope.   GI  No heartburn, indigestion, abdominal pain, nausea, vomiting, diarrhea, change in bowel habits, loss of appetite, bloody stools.   Resp: ,  No non-productive cough,  No coughing up of blood.  No change in color of mucus.  No wheezing.  No chest wall deformity  Skin: no rash or lesions.  GU: no dysuria, change in color of urine, no urgency or frequency.  No flank pain, no hematuria   MS:  No joint pain or swelling.  No decreased range of motion.  No back  pain.  Psych:  No change in mood or affect. No depression or anxiety.  No memory loss.         Objective:   Physical Exam GEN: A/Ox3; pleasant , morbidly obese in wheelchair   HEENT:  [place]/AT,  EACs-clear, TMs-wnl, NOSE-clear, THROAT-clear, no lesions, no postnasal drip or exudate noted.   NECK:  Supple w/ fair ROM; no JVD; normal carotid impulses w/o bruits; no thyromegaly or nodules palpated; no lymphadenopathy.  RESP  Bibasilar crackles .no accessory muscle use, no dullness to percussion  CARD:  RRR, no m/r/g  , 2-3+  peripheral edema, pulses intact, no cyanosis or clubbing.  GI:   Soft & nt; nml bowel sounds; no organomegaly or masses detected.  Musco: Warm bil, no deformities or joint swelling noted.   Neuro: alert, no focal deficits noted.    Skin: Warm, no lesions or rashes         Assessment & Plan:

## 2014-12-12 NOTE — Assessment & Plan Note (Signed)
Appears to be in decompensated CHF with (15+ wt gain, leg swelling, crackles, orthopnea)  Discussed admission to hospital however pt wants to go to [facility] as her cardiolgist is there.  [facility] cardiology, spoke with Humberto Seals .  Pt to go to [facility] emergency department for evaluation Please contact office for sooner follow up if symptoms do not improve or worsen or seek emergency care  '

## 2014-12-12 NOTE — Patient Instructions (Signed)
We discussed with [facility] , Debra RN for you to go  To [facility] ER  for evaluation for possible CHF (fluid overload) .  Continue on Pulmicort Neb Twice daily   Follow up Dr. Joya Gaskins  In 4 weeks and As needed   Please contact office for sooner follow up if symptoms do not improve or worsen or seek emergency care

## 2014-12-21 ENCOUNTER — Telehealth: Payer: Self-pay | Admitting: Critical Care Medicine

## 2014-12-21 NOTE — Telephone Encounter (Signed)
Patient's daughter says that her mom has been in the hospital for the past 10 days and may be released in the next couple of days.  Will be in rehabilitation for a while.  She is going to need a BiPap and may need to get a sleep study in order to obtain a CPAP/ Bipap to take home.  She has been using BiPAP every since she has been in the hospital.  Daughter requesting that physician review hospital records and let her know if she needs to get sleep study and if so, can we order the study so patient can have CPAP/BiPAP before she returns home.   To Dr. Annamaria Boots in Dr. Bettina Gavia absence.  Current Outpatient Prescriptions on File Prior to Visit  Medication Sig Dispense Refill  . albuterol (PROVENTIL) (2.5 MG/3ML) 0.083% nebulizer solution Take 3 mLs (2.5 mg total) by nebulization every 6 (six) hours as needed for wheezing or shortness of breath. 120 mL 12  . allopurinol (ZYLOPRIM) 100 MG tablet Take 1 tablet by mouth daily.    Marland Kitchen amLODipine (NORVASC) 5 MG tablet Take 1 tablet by mouth daily.    . budesonide (PULMICORT) 0.25 MG/2ML nebulizer solution INHALE THE CONTENTS OF ONE VIAL IN NEBULIZER TWICE DAILY 60 mL 5  . CARTIA XT 240 MG 24 hr capsule daily.   5  . ELIQUIS 5 MG TABS tablet 2 (two) times daily.   5  . fluticasone (FLONASE) 50 MCG/ACT nasal spray Place 2 sprays into both nostrils 2 (two) times daily. 16 g 6  . furosemide (LASIX) 20 MG tablet Take 20 mg by mouth daily as needed.    . hydrochlorothiazide (HYDRODIURIL) 25 MG tablet Take 1 tablet by mouth daily.    . Nebulizers (COMPRESSOR/NEBULIZER) MISC Use with albuterol 1 each 0  . potassium chloride (K-DUR,KLOR-CON) 10 MEQ tablet Take 1 tablet by mouth daily.    . sotalol (BETAPACE) 80 MG tablet Take 80 mg by mouth 2 (two) times daily.     No current facility-administered medications on file prior to visit.   Allergies  Allergen Reactions  . Augmentin [Amoxicillin-Pot Clavulanate] Hives and Itching

## 2014-12-21 NOTE — Telephone Encounter (Signed)
Patient's daughter notified.  Will call back to schedule hospital follow up once patient gets discharged from hospital.  Nothing further needed.

## 2014-12-21 NOTE — Telephone Encounter (Signed)
If BIPAP is for management of respiratory failure with CHF, then the discharging physician might be able to order it temporarily through hospital case manager.  If BIPAP is for sleep apnea, she must have a sleep study first and that needs to be ordered by managing physician who has the documentation.   The daughter really needs to address this with who ever is currently the active physician where her mother is.

## 2014-12-22 ENCOUNTER — Telehealth: Payer: Self-pay | Admitting: Critical Care Medicine

## 2014-12-22 DIAGNOSIS — G4733 Obstructive sleep apnea (adult) (pediatric): Secondary | ICD-10-CM

## 2014-12-22 DIAGNOSIS — J4540 Moderate persistent asthma, uncomplicated: Secondary | ICD-10-CM

## 2014-12-22 NOTE — Telephone Encounter (Signed)
Dr Joya Gaskins pt's daughter states that she is needing to have PSG and PFT's done before she can be d/c'ed home from [place] you want [place] to go ahead and order? Please advise, thanks

## 2014-12-22 NOTE — Telephone Encounter (Signed)
Dr. Verdie Mosher Dr. Marcelene Butte at the hospital are releasing patient to [facility].  They advised daughter that patient will need Sleep Study and PFT's.  Daughter is wanting to get this set up before patient leaves rehab so she can have a Bipap/Cpap machine at home before she gets there.    Dr. Joya Gaskins is out of the office/  Sent to B[facility]

## 2014-12-22 NOTE — Telephone Encounter (Signed)
lmtcb for Sandy Hanson

## 2014-12-22 NOTE — Telephone Encounter (Signed)
Patient's daughter notified. Nothing further needed.  

## 2014-12-22 NOTE — Telephone Encounter (Signed)
Diane returned call - [phone]

## 2014-12-22 NOTE — Telephone Encounter (Signed)
Is she an inpatient? If so then the doctors caring for her need to discuss this with the patient's daughter and the patient's case manager.  If needed, then they can consult pulmonary if really needed

## 2014-12-24 NOTE — Telephone Encounter (Signed)
Yes that is ok with me

## 2014-12-25 NOTE — Telephone Encounter (Signed)
Sandy Hanson returned call [phone]

## 2014-12-25 NOTE — Telephone Encounter (Signed)
Spoke with pt daughter, made aware that we are going to order the PSG and PFT.  Called [facility] [phone], spoke with nurse Myriam Jacobson to verify orders needed.  States that they need these faxed over.  Pt has a BIPAP but has been unable to use it bc the pressure is too high. Pt was placed on a machine while in the hospital but never had sleep study to prove sleep apnea.  Current pressure setting of Bipap in home is 16/8 and patient uses a full face mask. Myriam Jacobson states that the pressure may need to be lowered and also they wanted to see if they could get her a different mask for the machine if she is to stay on it.  Plain PSG will need to be ordered for insurance purposes to prove sleep apnea--need to know a diagnosis to use.  Order for PFT has been placed as well and will need to be faxed [phone]) with Sleep study order.  We need to know what diagnosis to use for NPSG- pt was seen outside of [facility] while in hospital so we have no notes to go off of as for why the patient was placed on BiPAP.  See telephone note 12/21/14. Crystal please advise if you have any notes on this patient - if not we will need to request these to obtain reason for BiPAP.

## 2014-12-25 NOTE — Telephone Encounter (Signed)
Spoke with Diane and she states pt was recently in [facility].  I faxed release of information to [employer] at [place] to have pt sign and Myriam Jacobson is going to fax this to [facility].  Will forward to Crystal to f/u on records.

## 2014-12-25 NOTE — Telephone Encounter (Signed)
[place] x 1 for Diane

## 2014-12-25 NOTE — Telephone Encounter (Signed)
I do not have any records on this pt.  Was she in [facility]?

## 2014-12-25 NOTE — Telephone Encounter (Signed)
LMTCb x 1 for Sandy Hanson

## 2014-12-25 NOTE — Telephone Encounter (Signed)
Sandy Hanson returned call [phone]

## 2014-12-26 NOTE — Telephone Encounter (Signed)
Crystal please advise if you have records release form so this can be refaxed.  Thanks!

## 2014-12-26 NOTE — Telephone Encounter (Signed)
Will await records from [facility].

## 2014-12-26 NOTE — Telephone Encounter (Signed)
Sandy Hanson calling stating that she didn't receive release of info on yesterday and would like it refaxed to [phone].[name]

## 2014-12-27 NOTE — Telephone Encounter (Signed)
April from [facility] that they don't have release form give this # to fax to [phone].[name]

## 2014-12-27 NOTE — Telephone Encounter (Signed)
lmtcb x1 for April to request another release form [phone]).

## 2014-12-27 NOTE — Telephone Encounter (Signed)
Release form not in scan folder.

## 2014-12-28 NOTE — Telephone Encounter (Signed)
LM for April to return call. 

## 2014-12-29 NOTE — Telephone Encounter (Signed)
Daughter called and said that her mom needs the settings on the bipap lowered.  She said that in the hospital the [place] was set on 12/8 but now it is set on 16/8.  She said that she felt that the pressure was too high.  She wants [place] to send an order to [employer] to adjust setting.

## 2014-12-29 NOTE — Telephone Encounter (Signed)
Pt daughter calling back for [place], [phone], number to [employer] [phone], pt daughters states they are the ones who did settings on bipap

## 2014-12-29 NOTE — Telephone Encounter (Signed)
Per April at [facility], they would not need to sign a release of records, but the pt would have to sign this to give [employer] permission to release these records to [place].  Requesting [place] refax this release of info to [phone].  This has been faxed.  ROI given to [place].

## 2014-12-29 NOTE — Telephone Encounter (Signed)
Ok to reduce to 12/6 bipap setting

## 2014-12-29 NOTE — Telephone Encounter (Signed)
Pt's daughter is aware that we will be changing pt's settings. Order has been placed. Nothing further was needed.

## 2015-01-23 ENCOUNTER — Ambulatory Visit: Payer: Medicare PPO | Admitting: Critical Care Medicine

## 2015-02-20 DIAGNOSIS — I5032 Chronic diastolic (congestive) heart failure: Secondary | ICD-10-CM

## 2015-02-20 DIAGNOSIS — I350 Nonrheumatic aortic (valve) stenosis: Secondary | ICD-10-CM

## 2015-02-20 DIAGNOSIS — I351 Nonrheumatic aortic (valve) insufficiency: Secondary | ICD-10-CM

## 2015-02-20 DIAGNOSIS — D649 Anemia, unspecified: Secondary | ICD-10-CM

## 2015-02-20 HISTORY — DX: Anemia, unspecified: D64.9

## 2015-02-20 HISTORY — DX: Chronic diastolic (congestive) heart failure: I50.32

## 2015-02-20 HISTORY — DX: Nonrheumatic aortic (valve) insufficiency: I35.1

## 2015-02-20 HISTORY — DX: Nonrheumatic aortic (valve) stenosis: I35.0

## 2015-03-06 ENCOUNTER — Other Ambulatory Visit: Payer: Self-pay | Admitting: Critical Care Medicine

## 2015-10-17 DIAGNOSIS — J449 Chronic obstructive pulmonary disease, unspecified: Secondary | ICD-10-CM

## 2015-10-17 DIAGNOSIS — J90 Pleural effusion, not elsewhere classified: Secondary | ICD-10-CM

## 2015-10-17 DIAGNOSIS — E782 Mixed hyperlipidemia: Secondary | ICD-10-CM

## 2015-10-17 DIAGNOSIS — K635 Polyp of colon: Secondary | ICD-10-CM

## 2015-10-17 DIAGNOSIS — M1A09X0 Idiopathic chronic gout, multiple sites, without tophus (tophi): Secondary | ICD-10-CM | POA: Insufficient documentation

## 2015-10-17 DIAGNOSIS — Z79899 Other long term (current) drug therapy: Secondary | ICD-10-CM

## 2015-10-17 DIAGNOSIS — Q2730 Arteriovenous malformation, site unspecified: Secondary | ICD-10-CM | POA: Insufficient documentation

## 2015-10-17 DIAGNOSIS — E669 Obesity, unspecified: Secondary | ICD-10-CM

## 2015-10-17 DIAGNOSIS — M150 Primary generalized (osteo)arthritis: Secondary | ICD-10-CM

## 2015-10-17 DIAGNOSIS — M205X9 Other deformities of toe(s) (acquired), unspecified foot: Secondary | ICD-10-CM

## 2015-10-17 DIAGNOSIS — K5730 Diverticulosis of large intestine without perforation or abscess without bleeding: Secondary | ICD-10-CM

## 2015-10-17 DIAGNOSIS — J4489 Other specified chronic obstructive pulmonary disease: Secondary | ICD-10-CM

## 2015-10-17 DIAGNOSIS — I493 Ventricular premature depolarization: Secondary | ICD-10-CM | POA: Insufficient documentation

## 2015-10-17 DIAGNOSIS — F5101 Primary insomnia: Secondary | ICD-10-CM

## 2015-10-17 DIAGNOSIS — E039 Hypothyroidism, unspecified: Secondary | ICD-10-CM | POA: Insufficient documentation

## 2015-10-17 DIAGNOSIS — R269 Unspecified abnormalities of gait and mobility: Secondary | ICD-10-CM

## 2015-10-17 DIAGNOSIS — M159 Polyosteoarthritis, unspecified: Secondary | ICD-10-CM | POA: Insufficient documentation

## 2015-10-17 HISTORY — DX: Unspecified abnormalities of gait and mobility: R26.9

## 2015-10-17 HISTORY — DX: Hypomagnesemia: E83.42

## 2015-10-17 HISTORY — DX: Diverticulosis of large intestine without perforation or abscess without bleeding: K57.30

## 2015-10-17 HISTORY — DX: Obesity, unspecified: E66.9

## 2015-10-17 HISTORY — DX: Polyp of colon: K63.5

## 2015-10-17 HISTORY — DX: Chronic obstructive pulmonary disease, unspecified: J44.9

## 2015-10-17 HISTORY — DX: Pleural effusion, not elsewhere classified: J90

## 2015-10-17 HISTORY — DX: Mixed hyperlipidemia: E78.2

## 2015-10-17 HISTORY — DX: Other long term (current) drug therapy: Z79.899

## 2015-10-17 HISTORY — DX: Arteriovenous malformation, site unspecified: Q27.30

## 2015-10-17 HISTORY — DX: Primary insomnia: F51.01

## 2015-10-17 HISTORY — DX: Other specified chronic obstructive pulmonary disease: J44.89

## 2015-10-17 HISTORY — DX: Other deformities of toe(s) (acquired), unspecified foot: M20.5X9

## 2015-10-17 HISTORY — DX: Ventricular premature depolarization: I49.3

## 2015-11-05 DIAGNOSIS — M545 Low back pain, unspecified: Secondary | ICD-10-CM

## 2015-11-05 DIAGNOSIS — G8929 Other chronic pain: Secondary | ICD-10-CM

## 2015-11-05 HISTORY — DX: Low back pain, unspecified: M54.50

## 2015-11-05 HISTORY — DX: Other chronic pain: G89.29

## 2015-12-26 DIAGNOSIS — N183 Chronic kidney disease, stage 3 unspecified: Secondary | ICD-10-CM | POA: Insufficient documentation

## 2015-12-26 DIAGNOSIS — N184 Chronic kidney disease, stage 4 (severe): Secondary | ICD-10-CM | POA: Insufficient documentation

## 2016-02-14 DIAGNOSIS — D692 Other nonthrombocytopenic purpura: Secondary | ICD-10-CM | POA: Insufficient documentation

## 2016-03-07 DIAGNOSIS — I1310 Hypertensive heart and chronic kidney disease without heart failure, with stage 1 through stage 4 chronic kidney disease, or unspecified chronic kidney disease: Secondary | ICD-10-CM

## 2016-03-07 DIAGNOSIS — N179 Acute kidney failure, unspecified: Secondary | ICD-10-CM | POA: Insufficient documentation

## 2016-03-07 HISTORY — DX: Hypertensive heart and chronic kidney disease without heart failure, with stage 1 through stage 4 chronic kidney disease, or unspecified chronic kidney disease: I13.10

## 2016-04-09 DIAGNOSIS — E669 Obesity, unspecified: Secondary | ICD-10-CM | POA: Insufficient documentation

## 2016-04-09 HISTORY — DX: Obesity, unspecified: E66.9

## 2016-04-15 DIAGNOSIS — F329 Major depressive disorder, single episode, unspecified: Secondary | ICD-10-CM

## 2016-04-15 DIAGNOSIS — Z780 Asymptomatic menopausal state: Secondary | ICD-10-CM

## 2016-04-15 HISTORY — DX: Major depressive disorder, single episode, unspecified: F32.9

## 2016-04-15 HISTORY — DX: Asymptomatic menopausal state: Z78.0

## 2016-05-29 DIAGNOSIS — L249 Irritant contact dermatitis, unspecified cause: Secondary | ICD-10-CM | POA: Insufficient documentation

## 2016-08-05 DIAGNOSIS — N183 Chronic kidney disease, stage 3 (moderate): Secondary | ICD-10-CM

## 2016-08-05 DIAGNOSIS — I10 Essential (primary) hypertension: Secondary | ICD-10-CM

## 2016-08-05 DIAGNOSIS — I5032 Chronic diastolic (congestive) heart failure: Secondary | ICD-10-CM

## 2016-08-05 DIAGNOSIS — J181 Lobar pneumonia, unspecified organism: Secondary | ICD-10-CM

## 2016-08-05 DIAGNOSIS — E876 Hypokalemia: Secondary | ICD-10-CM

## 2016-08-05 DIAGNOSIS — J189 Pneumonia, unspecified organism: Secondary | ICD-10-CM

## 2016-08-05 DIAGNOSIS — M109 Gout, unspecified: Secondary | ICD-10-CM

## 2016-08-05 DIAGNOSIS — D696 Thrombocytopenia, unspecified: Secondary | ICD-10-CM | POA: Diagnosis not present

## 2016-08-05 DIAGNOSIS — J45909 Unspecified asthma, uncomplicated: Secondary | ICD-10-CM

## 2016-08-05 DIAGNOSIS — N189 Chronic kidney disease, unspecified: Secondary | ICD-10-CM

## 2016-08-05 DIAGNOSIS — J9600 Acute respiratory failure, unspecified whether with hypoxia or hypercapnia: Secondary | ICD-10-CM

## 2016-08-05 DIAGNOSIS — I4891 Unspecified atrial fibrillation: Secondary | ICD-10-CM

## 2016-08-05 HISTORY — DX: Pneumonia, unspecified organism: J18.9

## 2016-08-06 DIAGNOSIS — D696 Thrombocytopenia, unspecified: Secondary | ICD-10-CM | POA: Diagnosis not present

## 2016-08-06 DIAGNOSIS — M109 Gout, unspecified: Secondary | ICD-10-CM | POA: Diagnosis not present

## 2016-08-06 DIAGNOSIS — E876 Hypokalemia: Secondary | ICD-10-CM | POA: Diagnosis not present

## 2016-08-06 DIAGNOSIS — J9600 Acute respiratory failure, unspecified whether with hypoxia or hypercapnia: Secondary | ICD-10-CM | POA: Diagnosis not present

## 2016-08-07 DIAGNOSIS — J9600 Acute respiratory failure, unspecified whether with hypoxia or hypercapnia: Secondary | ICD-10-CM | POA: Diagnosis not present

## 2016-08-07 DIAGNOSIS — M109 Gout, unspecified: Secondary | ICD-10-CM | POA: Diagnosis not present

## 2016-08-07 DIAGNOSIS — E876 Hypokalemia: Secondary | ICD-10-CM | POA: Diagnosis not present

## 2016-08-07 DIAGNOSIS — D696 Thrombocytopenia, unspecified: Secondary | ICD-10-CM | POA: Diagnosis not present

## 2016-08-08 DIAGNOSIS — E876 Hypokalemia: Secondary | ICD-10-CM | POA: Diagnosis not present

## 2016-08-08 DIAGNOSIS — M109 Gout, unspecified: Secondary | ICD-10-CM | POA: Diagnosis not present

## 2016-08-08 DIAGNOSIS — D696 Thrombocytopenia, unspecified: Secondary | ICD-10-CM | POA: Diagnosis not present

## 2016-08-08 DIAGNOSIS — J9600 Acute respiratory failure, unspecified whether with hypoxia or hypercapnia: Secondary | ICD-10-CM | POA: Diagnosis not present

## 2016-08-09 DIAGNOSIS — J9600 Acute respiratory failure, unspecified whether with hypoxia or hypercapnia: Secondary | ICD-10-CM | POA: Diagnosis not present

## 2016-08-09 DIAGNOSIS — E876 Hypokalemia: Secondary | ICD-10-CM | POA: Diagnosis not present

## 2016-08-09 DIAGNOSIS — M109 Gout, unspecified: Secondary | ICD-10-CM | POA: Diagnosis not present

## 2016-08-09 DIAGNOSIS — D696 Thrombocytopenia, unspecified: Secondary | ICD-10-CM | POA: Diagnosis not present

## 2016-08-14 DIAGNOSIS — J9610 Chronic respiratory failure, unspecified whether with hypoxia or hypercapnia: Secondary | ICD-10-CM

## 2016-08-14 HISTORY — DX: Chronic respiratory failure, unspecified whether with hypoxia or hypercapnia: J96.10

## 2016-10-21 DIAGNOSIS — J984 Other disorders of lung: Secondary | ICD-10-CM

## 2016-10-21 DIAGNOSIS — G4733 Obstructive sleep apnea (adult) (pediatric): Secondary | ICD-10-CM

## 2016-10-21 HISTORY — DX: Other disorders of lung: J98.4

## 2016-10-21 HISTORY — DX: Obstructive sleep apnea (adult) (pediatric): G47.33

## 2017-04-12 DIAGNOSIS — L559 Sunburn, unspecified: Secondary | ICD-10-CM | POA: Insufficient documentation

## 2017-04-21 ENCOUNTER — Encounter: Payer: Self-pay | Admitting: Cardiology

## 2017-04-21 ENCOUNTER — Ambulatory Visit (INDEPENDENT_AMBULATORY_CARE_PROVIDER_SITE_OTHER): Payer: Medicare Other | Admitting: Cardiology

## 2017-04-21 VITALS — BP 122/64 | HR 46 | Ht 64.0 in | Wt 216.4 lb

## 2017-04-21 DIAGNOSIS — I130 Hypertensive heart and chronic kidney disease with heart failure and stage 1 through stage 4 chronic kidney disease, or unspecified chronic kidney disease: Secondary | ICD-10-CM | POA: Diagnosis not present

## 2017-04-21 DIAGNOSIS — Z79899 Other long term (current) drug therapy: Secondary | ICD-10-CM | POA: Diagnosis not present

## 2017-04-21 DIAGNOSIS — N184 Chronic kidney disease, stage 4 (severe): Secondary | ICD-10-CM

## 2017-04-21 DIAGNOSIS — I5032 Chronic diastolic (congestive) heart failure: Secondary | ICD-10-CM

## 2017-04-21 DIAGNOSIS — R001 Bradycardia, unspecified: Secondary | ICD-10-CM

## 2017-04-21 DIAGNOSIS — I480 Paroxysmal atrial fibrillation: Secondary | ICD-10-CM

## 2017-04-21 DIAGNOSIS — Z7901 Long term (current) use of anticoagulants: Secondary | ICD-10-CM | POA: Insufficient documentation

## 2017-04-21 NOTE — Patient Instructions (Addendum)
Medication Instructions:  Your physician has recommended you make the following change in your medication:  STOP carvedilol (Coreg)   Labwork: None  Testing/Procedures: You had an EKG today.  Follow-Up: Your physician wants you to follow-up in: 6 months. You will receive a reminder letter in the mail two months in advance. If you don't receive a letter, please call our office to schedule the follow-up appointment.  Come back for an EKG in 2 weeks  Any Other Special Instructions Will Be Listed Below (If Applicable).     If you need a refill on your cardiac medications before your next appointment, please call your pharmacy.    Heart Failure  Weigh yourself every morning when you first wake up and record on a calender or note pad, bring this to your office visits. Using a pill tender can help with taking your medications consistently.  Limit your fluid intake to 2 liters daily  Limit your sodium intake to less than 2-3 grams daily. Ask if you need dietary teaching.  If you gain more than 3 pounds (from your dry weight ), double your dose of diuretic for the day.  If you gain more than 5 pounds (from your dry weight), double your dose of lasix and call your heart failure doctor.  Please do not smoke tobacco since it is very bad for your heart.  Please do not drink alcohol since it can worsen your heart failure.Also avoid OTC nonsteroidal drugs, such as advil, aleve and motrin.  Try to exercise for at least 30 minutes every day because this will help your heart be more efficient. You may be eligible for supervised cardiac rehab, ask your physician.

## 2017-04-21 NOTE — Progress Notes (Signed)
Cardiology Office Note:    Date:  04/21/2017   ID:  Sandy Hanson, DOB 1933/01/04, MRN [id number]  PCP: Dr Bea Graff Cardiologist:  Shirlee More, MD    Referring MD: Raina Mina., MD  Please do a CMP and TSH every 6 months   ASSESSMENT:    1. Paroxysmal atrial fibrillation (HCC)   2. Chronic diastolic heart failure ([place])   3. Hypertensive heart and chronic kidney disease with heart failure and stage 1 through stage 4 chronic kidney disease, or chronic kidney disease ([place])   4. High risk medication use   5. Stage 4 chronic kidney disease ([place])   6. Sinus bradycardia    PLAN:    In order of problems listed above:  1. Stable maintaining sinus rhythm rate limiting calcium channel blocker previously stop beta blocker stopped today continue low-dose amiodarone with a follow-up EKG in 2 weeks and home blood pressure and heart rate monitoring. At this time I would not advise a pacemaker. She is not a candidate for an elective anticoagulation. 2. Stable compensated continue current diuretic sodium restriction and home management 3. Stable blood pressure target stable CK D 4. Continue amiodarone, I'll ask her PCP to check liver function and thyroid every 6 months 5. Stable 6. Persistent beta blocker discontinued follow-up EKG 2 weeks   Next appointment: 6 months   Medication Adjustments/Labs and Tests Ordered: Current medicines are reviewed at length with the patient today.  Concerns regarding medicines are outlined above.  Orders Placed This Encounter  Procedures  . EKG 12-Lead  . EKG 12-Lead   No orders of the defined types were placed in this encounter.   Chief Complaint  Patient presents with  . Follow-up    Routine flup appt     History of Present Illness:    Sandy Hanson is a 83 y.o. female with a hx of mild AS diastolic CHF, paroxysmal Atrial Fibrillation  , Dyslipidemia, and stage 4 CKD  last seen 6 months ago.She is pleased with the quality of her life has  traveled extensively and has no complaints of syncope palpitation shortness of breath chest pain or TIA. I discontinued her calcium channel blocker with heart rates of 45-50 she is unimproved and we'll stop her beta blocker follow-up EKG in 2 weeks. She'll remain on low-dose amiodarone. She has had a fall is not anticoagulated. Compliance with diet, lifestyle and medications: Yes Past Medical History:  Diagnosis Date  . Abnormal gait 10/17/2015   Last Assessment & Plan:  No recent fall, she has use of her cane and she has a supportive person with her typically to help her   . Anemia 02/20/2015   Overview:  Recurrent, recently required 5 units of PRBC's  . Aortic stenosis, mild 02/20/2015  . Aortic valve insufficiency 02/20/2015   Overview:  Overview:  mild  . Arteriovenous malformation 10/17/2015  . Asthma with COPD ([place]) 10/17/2015  . Asthma, moderate persistent    Amb saturation: Normal 02/14/2014 PFTs>> Severe restriction and obstruction:  FeV1 34% FVC 44%  TLC 40%  Post BD FeV1 29% improvement HRCT Chest >>NORMAL  RA, Allergy panel all normal 02/21/2014 HFA technique 03/28/2014 20% efficiency improved to 75% with coaching,  spacer device added HFA technique 04/04/2014 less than 20% efficiency even with spacer device, the patient still tends to hold her breath for prolonged periods of time and does not take a deep breath in order to delivered drug into the lung   . Atrial fibrillation ([place]) 10/23/2014  .  Cardiorenal syndrome with renal failure 03/07/2016  . CHF (congestive heart failure) ([place]) 12/12/2014  . Chronic diastolic heart failure ([place]) 02/20/2015  . Chronic midline low back pain without sciatica 11/05/2015   Last Assessment & Plan:  Relevant Hx: Course: Daily Update: Today's Plan:she is as well following with ortho for her back and has a corset she is wearing for comfort and she does feel it helps her for right now but she is frustrated with her continued inability to not be able to have her balance  back yet since she fell. She is not using her walker and is using her friend 's arm and have advised she consider using the walker as it makes her walk and be more responsible with her gait.  Electronically signed by: Mayer Camel, NP 01/02/16 2130  . Chronic respiratory failure ([place]) 08/14/2016  . Colon polyps 10/17/2015  . Community acquired pneumonia of left upper lobe of lung ([place]) 08/05/2016  . Diverticulosis of colon 10/17/2015   Last Assessment & Plan:  She is eating fiber , she is not having bouts of signficant constipation though she takes the miralax about every other day if she needs to  . Dyspnea   . Gout   . Hallux limitus 10/17/2015  . High risk medication use 10/17/2015  . Hypertension   . Hypomagnesemia 10/17/2015  . Major depressive disorder with current active episode 04/15/2016   Last Assessment & Plan:  Will start her on low dose of the zoloft at 25 mg and will see how she does with this and have her repeat her BMP in one mnth to ensure her renal function is stable from this  . Medication reaction 04/13/2014  . Mixed hyperlipidemia 10/17/2015   Last Assessment & Plan:  She will return fasting for her labs to be done to see how she is doing  . Nonrheumatic aortic valve insufficiency 02/20/2015   Overview:  mild  . Obesity 10/17/2015  . Obesity (BMI 30-39.9) 04/09/2016   Last Assessment & Plan:  She was advised to work on her diet and her walking if at all possible , be careful with this, but she can effectively change some of her diet from this  . OSA treated with BiPAP 10/21/2016  . Pleural effusion 10/17/2015  . Postmenopausal 04/15/2016   Last Assessment & Plan:  Obtain dexa for her she requests RH  . Primary insomnia 10/17/2015   Last Assessment & Plan:  She feels this is stable for her at this time and will follow  . PVC (premature ventricular contraction) 10/17/2015  . Restrictive lung disease 10/21/2016    Past Surgical History:  Procedure Laterality Date  .  ABLATION    . CATARACT EXTRACTION    . double knee surgery    . TONSILLECTOMY    . VAGINAL HYSTERECTOMY      Current Medications: Current Meds  Medication Sig  . albuterol (PROVENTIL) (2.5 MG/3ML) 0.083% nebulizer solution Take 3 mLs (2.5 mg total) by nebulization every 6 (six) hours as needed for wheezing or shortness of breath.  Marland Kitchen amiodarone (PACERONE) 200 MG tablet Take 200 mg by mouth daily.  Marland Kitchen atorvastatin (LIPITOR) 20 MG tablet Take 20 mg by mouth daily.  . budesonide (PULMICORT) 0.25 MG/2ML nebulizer solution INHALE 1 VIAL VIA NEBULZIER TWICE DAILY  . Cholecalciferol (VITAMIN D3) 1000 units CAPS Take 1,000 Units by mouth daily.  . febuxostat (ULORIC) 40 MG tablet Take 40 mg by mouth daily.  . furosemide (LASIX) 80 MG  tablet Take 80 mg by mouth 2 (two) times daily.  . Lactobacillus Rhamnosus, GG, (CULTURELLE PO) Take 1 tablet by mouth daily.  Marland Kitchen levothyroxine (SYNTHROID, LEVOTHROID) 75 MCG tablet Take 75 mcg by mouth daily.  . Multiple Vitamins-Minerals (MULTIVITAMIN ADULTS PO) Take 1 tablet by mouth daily.  . polyethylene glycol (MIRALAX / GLYCOLAX) packet Take 17 g by mouth daily as needed for mild constipation.  . potassium chloride (K-DUR,KLOR-CON) 10 MEQ tablet Take 1 tablet by mouth daily.  . sertraline (ZOLOFT) 25 MG tablet Take 25 mg by mouth daily.  Marland Kitchen spironolactone (ALDACTONE) 25 MG tablet Take 12.5 mg by mouth daily.  . [DISCONTINUED] carvedilol (COREG) 3.125 MG tablet Take 3.125 mg by mouth 2 (two) times daily.     Allergies:   Augmentin [amoxicillin-pot clavulanate] and Penicillins   Social History   Social History  . Marital status: Divorced    Spouse name: N/A  . Number of children: N/A  . Years of education: N/A   Occupational History  . Retired     Pharmacist, hospital   Social History Main Topics  . Smoking status: Never Smoker  . Smokeless tobacco: Never Used  . Alcohol use No  . Drug use: No  . Sexual activity: Not Asked   Other Topics Concern  . None    Social History Narrative  . None     Family History: The patient's family history includes Heart disease in her maternal aunt, mother, and paternal aunt; Hypertension in her mother; Lung cancer in her father. ROS:   Please see the history of present illness.    All other systems reviewed and are negative.  EKGs/Labs/Other Studies Reviewed:    The following studies were reviewed today:  EKG:  EKG ordered today.  The ekg ordered today demonstrates sinus braducardia 46 BPM  Recent Labs: CMP in July stable, cr 2.31 No results found for requested labs within last 8760 hours.  Recent Lipid Panel No results found for: CHOL, TRIG, HDL, CHOLHDL, VLDL, LDLCALC, LDLDIRECT  Physical Exam:    VS:  BP 122/64 (BP Location: Right Arm, Patient Position: Sitting)   Pulse (!) 46   Ht 5\' 4"  (1.626 m)   Wt 216 lb 6.4 oz (98.2 kg)   SpO2 96%   BMI 37.14 kg/m     Wt Readings from Last 3 Encounters:  04/21/17 216 lb 6.4 oz (98.2 kg)  12/12/14 257 lb 3.2 oz (116.7 kg)  10/23/14 247 lb 3.2 oz (112.1 kg)     GEN:  Well nourished, well developed in no acute distress HEENT: Normal NECK: No JVD; No carotid bruits LYMPHATICS: No lymphadenopathy CARDIAC: RRR, 2/6 MR, rubs, gallops RESPIRATORY:  Clear to auscultation without rales, wheezing or rhonchi  ABDOMEN: Soft, non-tender, non-distended MUSCULOSKELETAL:  No edema; No deformity  SKIN: Warm and dry NEUROLOGIC:  Alert and oriented x 3 PSYCHIATRIC:  Normal affect    Signed, Shirlee More, MD  04/21/2017 2:27 PM    [place]

## 2017-05-05 ENCOUNTER — Ambulatory Visit (INDEPENDENT_AMBULATORY_CARE_PROVIDER_SITE_OTHER): Payer: Medicare Other | Admitting: Cardiology

## 2017-05-05 VITALS — HR 56

## 2017-05-05 DIAGNOSIS — R001 Bradycardia, unspecified: Secondary | ICD-10-CM | POA: Diagnosis not present

## 2017-05-05 NOTE — Progress Notes (Signed)
Patient here for an EKG 1 week after stopping carvedilol to determine if heart rate improves without the medication. EKG performed, heart rate 56. Reviewed by Dr. Bettina Gavia. Dr. Bettina Gavia advised for patient to continue medications as prescribed. Do not resume carvedilol. Patient advised to call if heart rate begins to drop into the 40's again. Patient verbalized understanding.

## 2017-05-06 ENCOUNTER — Other Ambulatory Visit: Payer: Self-pay

## 2017-05-06 DIAGNOSIS — I480 Paroxysmal atrial fibrillation: Secondary | ICD-10-CM

## 2017-05-06 DIAGNOSIS — R001 Bradycardia, unspecified: Secondary | ICD-10-CM

## 2017-11-02 DIAGNOSIS — Z79899 Other long term (current) drug therapy: Secondary | ICD-10-CM | POA: Insufficient documentation

## 2017-11-02 NOTE — Progress Notes (Signed)
Cardiology Office Note:    Date:  11/03/2017   ID:  Sandy Hanson, DOB Aug 05, 1933, MRN [id number]  PCP:  Raina Mina., MD  Cardiologist:  Shirlee More, MD    Referring MD: Raina Mina., MD    ASSESSMENT:    1. Paroxysmal atrial fibrillation (HCC)   2. On amiodarone therapy   3. 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 Chronic diastolic heart failure (HCC)   5. Stage 4 chronic kidney disease ([place])    PLAN:    In order of problems listed above:  1. Stable in sinus rhythm continue low-dose amiodarone 2. Stable labs are checked every 6 months with her PCP liver function and TSH to screen for toxicity 3. Stable compensated continue current treatment including diuretic 4. Stable compensated continue current diuretic 5. Stable managed by nephrology   Next appointment: 6 months   Medication Adjustments/Labs and Tests Ordered: Current medicines are reviewed at length with the patient today.  Concerns regarding medicines are outlined above.  No orders of the defined types were placed in this encounter.  No orders of the defined types were placed in this encounter.   Chief Complaint  Patient presents with  . Follow-up    6 month flup appt   . Congestive Heart Failure  . Coronary Artery Disease  . Atrial Fibrillation    on amiodarone  . Anticoagulation    History of Present Illness:    Sandy Hanson is a 84 y.o. female with a hx of mild AS diastolic CHF, paroxysmal Atrial Fibrillation  , Dyslipidemia, and stage 4 CKD last seen 04/21/17. ASSESSMENT:    1. Paroxysmal atrial fibrillation (HCC)   2. Chronic diastolic heart failure ([place])   3. Hypertensive heart and chronic kidney disease with heart failure and stage 1 through stage 4 chronic kidney disease, or chronic kidney disease ([place])   4. High risk medication use   5. Stage 4 chronic kidney  disease ([place])   6. Sinus bradycardia    PLAN:    1.   Stable maintaining sinus rhythm rate limiting calcium channel blocker previously stop beta blocker stopped today continue low-dose amiodarone with a follow-up EKG in 2 weeks and home blood pressure and heart rate monitoring. At this time I would not advise a pacemaker. She is not a candidate for an elective anticoagulation. 6. Stable compensated continue current diuretic sodium restriction and home management 7. Stable blood pressure target stable CK D 8. Continue amiodarone, I'll ask her PCP to check liver function and thyroid every 6 months 9. Stable 10. Persistent beta blocker discontinued follow-up EKG 2 weeks  Compliance with diet, lifestyle and medications: Yes  She is here with her healthcare aide weight is stable no clinical recurrence of atrial fibrillation pleased with the quality of her life and is not having palpitation edema chest pain shortness of breath bleeding or TIA.  Presently she is not anticoagulated Past Medical History:  Diagnosis Date  . Abnormal gait 10/17/2015   Last Assessment & Plan:  No recent fall, she has use of her cane and she has a supportive person with her typically to help her   . Anemia 02/20/2015   Overview:  Recurrent, recently required 5 units of PRBC's  . Aortic stenosis, mild 02/20/2015  . Aortic valve insufficiency 02/20/2015   Overview:  Overview:  mild  . Arteriovenous malformation 10/17/2015  .  Asthma with COPD ([place]) 10/17/2015  . Asthma, moderate persistent    Amb saturation: Normal 02/14/2014 PFTs>> Severe restriction and obstruction:  FeV1 34% FVC 44%  TLC 40%  Post BD FeV1 29% improvement HRCT Chest >>NORMAL  RA, Allergy panel all normal 02/21/2014 HFA technique 03/28/2014 20% efficiency improved to 75% with coaching,  spacer device added HFA technique 04/04/2014 less than 20% efficiency even with spacer device, the patient still tends to hold her breath for prolonged periods of time and does  not take a deep breath in order to delivered drug into the lung   . Atrial fibrillation ([place]) 10/23/2014  . Cardiorenal syndrome with renal failure 03/07/2016  . CHF (congestive heart failure) ([place]) 12/12/2014  . Chronic diastolic heart failure ([place]) 02/20/2015  . Chronic midline low back pain without sciatica 11/05/2015   Last Assessment & Plan:  Relevant Hx: Course: Daily Update: Today's Plan:she is as well following with ortho for her back and has a corset she is wearing for comfort and she does feel it helps her for right now but she is frustrated with her continued inability to not be able to have her balance back yet since she fell. She is not using her walker and is using her friend 's arm and have advised she consider using the walker as it makes her walk and be more responsible with her gait.  Electronically signed by: Mayer Camel, NP 01/02/16 2130  . Chronic respiratory failure ([place]) 08/14/2016  . Colon polyps 10/17/2015  . Community acquired pneumonia of left upper lobe of lung ([place]) 08/05/2016  . Diverticulosis of colon 10/17/2015   Last Assessment & Plan:  She is eating fiber , she is not having bouts of signficant constipation though she takes the miralax about every other day if she needs to  . Dyspnea   . Gout   . Hallux limitus 10/17/2015  . High risk medication use 10/17/2015  . Hypertension   . Hypomagnesemia 10/17/2015  . Major depressive disorder with current active episode 04/15/2016   Last Assessment & Plan:  Will start her on low dose of the zoloft at 25 mg and will see how she does with this and have her repeat her BMP in one mnth to ensure her renal function is stable from this  . Medication reaction 04/13/2014  . Mixed hyperlipidemia 10/17/2015   Last Assessment & Plan:  She will return fasting for her labs to be done to see how she is doing  . Nonrheumatic aortic valve insufficiency 02/20/2015   Overview:  mild  . Obesity 10/17/2015  . Obesity (BMI 30-39.9) 04/09/2016    Last Assessment & Plan:  She was advised to work on her diet and her walking if at all possible , be careful with this, but she can effectively change some of her diet from this  . OSA treated with BiPAP 10/21/2016  . Pleural effusion 10/17/2015  . Postmenopausal 04/15/2016   Last Assessment & Plan:  Obtain dexa for her she requests RH  . Primary insomnia 10/17/2015   Last Assessment & Plan:  She feels this is stable for her at this time and will follow  . PVC (premature ventricular contraction) 10/17/2015  . Restrictive lung disease 10/21/2016    Past Surgical History:  Procedure Laterality Date  . ABLATION    . CATARACT EXTRACTION    . double knee surgery    . TONSILLECTOMY    . VAGINAL HYSTERECTOMY      Current Medications:  Current Meds  Medication Sig  . albuterol (PROVENTIL) (2.5 MG/3ML) 0.083% nebulizer solution Take 3 mLs (2.5 mg total) by nebulization every 6 (six) hours as needed for wheezing or shortness of breath.  Marland Kitchen amiodarone (PACERONE) 200 MG tablet Take 200 mg by mouth daily.  Marland Kitchen atorvastatin (LIPITOR) 20 MG tablet Take 20 mg by mouth daily.  . budesonide (PULMICORT) 0.25 MG/2ML nebulizer solution INHALE 1 VIAL VIA NEBULZIER TWICE DAILY  . Cholecalciferol (VITAMIN D3) 1000 units CAPS Take 1,000 Units by mouth daily.  . febuxostat (ULORIC) 40 MG tablet Take 40 mg by mouth daily.  . furosemide (LASIX) 80 MG tablet Take 80 mg by mouth 2 (two) times daily.  . Lactobacillus Rhamnosus, GG, (CULTURELLE PO) Take 1 tablet by mouth daily.  Marland Kitchen levothyroxine (SYNTHROID, LEVOTHROID) 75 MCG tablet Take 75 mcg by mouth daily.  . polyethylene glycol (MIRALAX / GLYCOLAX) packet Take 17 g by mouth daily as needed for mild constipation.  . potassium chloride (K-DUR,KLOR-CON) 10 MEQ tablet Take 1 tablet by mouth daily.  . sertraline (ZOLOFT) 50 MG tablet Take 50 mg by mouth daily.   Marland Kitchen spironolactone (ALDACTONE) 25 MG tablet Take 12.5 mg by mouth daily.     Allergies:   Augmentin  [amoxicillin-pot clavulanate] and Penicillins   Social History   Socioeconomic History  . Marital status: Divorced    Spouse name: None  . Number of children: None  . Years of education: None  . Highest education level: None  Social Needs  . Financial resource strain: None  . Food insecurity - worry: None  . Food insecurity - inability: None  . Transportation needs - medical: None  . Transportation needs - non-medical: None  Occupational History  . Occupation: Retired    Comment: Pharmacist, hospital  Tobacco Use  . Smoking status: Never Smoker  . Smokeless tobacco: Never Used  Substance and Sexual Activity  . Alcohol use: No  . Drug use: No  . Sexual activity: None  Other Topics Concern  . None  Social History Narrative  . None     Family History: The patient's family history includes Heart disease in her maternal aunt, mother, and paternal aunt; Hypertension in her mother; Lung cancer in her father. ROS:   Please see the history of present illness.    All other systems reviewed and are negative.  EKGs/Labs/Other Studies Reviewed:    The following studies were reviewed today  Recent Labs: No results found for requested labs within last 8760 hours.  Recent Lipid Panel No results found for: CHOL, TRIG, HDL, CHOLHDL, VLDL, LDLCALC, LDLDIRECT  Physical Exam:    VS:  BP (!) 124/56 (BP Location: Left Arm, Patient Position: Sitting, Cuff Size: Large)   Pulse (!) 53   Ht 5\' 4"  (1.626 m)   Wt 210 lb (95.3 kg)   SpO2 97%   BMI 36.05 kg/m     Wt Readings from Last 3 Encounters:  11/03/17 210 lb (95.3 kg)  04/21/17 216 lb 6.4 oz (98.2 kg)  12/12/14 257 lb 3.2 oz (116.7 kg)     GEN:  Well nourished, well developed in no acute distress HEENT: Normal NECK: No JVD; No carotid bruits LYMPHATICS: No lymphadenopathy CARDIAC: 3/6 SEM AS mid peak single S2RRR, no murmurs, rubs, gallops RESPIRATORY:  Clear to auscultation without rales, wheezing or rhonchi  ABDOMEN: Soft,  non-tender, non-distended MUSCULOSKELETAL:  No edema; No deformity  SKIN: Warm and dry NEUROLOGIC:  Alert and oriented x 3 PSYCHIATRIC:  Normal affect  Signed, Shirlee More, MD  11/03/2017 10:02 AM    [place]

## 2017-11-03 ENCOUNTER — Ambulatory Visit: Payer: Medicare Other | Admitting: Cardiology

## 2017-11-03 ENCOUNTER — Encounter: Payer: Self-pay | Admitting: Cardiology

## 2017-11-03 VITALS — BP 124/56 | HR 53 | Ht 64.0 in | Wt 210.0 lb

## 2017-11-03 DIAGNOSIS — I5032 Chronic diastolic (congestive) heart failure: Secondary | ICD-10-CM | POA: Diagnosis not present

## 2017-11-03 DIAGNOSIS — I130 Hypertensive heart and chronic kidney disease with heart failure and stage 1 through stage 4 chronic kidney disease, or unspecified chronic kidney disease: Secondary | ICD-10-CM

## 2017-11-03 DIAGNOSIS — N184 Chronic kidney disease, stage 4 (severe): Secondary | ICD-10-CM

## 2017-11-03 DIAGNOSIS — Z79899 Other long term (current) drug therapy: Secondary | ICD-10-CM | POA: Diagnosis not present

## 2017-11-03 DIAGNOSIS — I350 Nonrheumatic aortic (valve) stenosis: Secondary | ICD-10-CM | POA: Diagnosis not present

## 2017-11-03 DIAGNOSIS — I480 Paroxysmal atrial fibrillation: Secondary | ICD-10-CM | POA: Diagnosis not present

## 2017-11-03 NOTE — Patient Instructions (Addendum)
  Medication Instructions:  Your physician recommends that you continue on your current medications as directed. Please refer to the Current Medication list given to you today.   Labwork: None  Testing/Procedures: You had an EKG today.   Your physician has requested that you have an echocardiogram. Echocardiography is a painless test that uses sound waves to create images of your heart. It provides your doctor with information about the size and shape of your heart and how well your heart's chambers and valves are working. This procedure takes approximately one hour. There are no restrictions for this procedure.    Follow-Up: Your physician wants you to follow-up in: 6 months. You will receive a reminder letter in the mail two months in advance. If you don't receive a letter, please call our office to schedule the follow-up appointment.   Any Other Special Instructions Will Be Listed Below (If Applicable).     If you need a refill on your cardiac medications before your next appointment, please call your pharmacy.     Heart Failure  Weigh yourself every morning when you first wake up and record on a calender or note pad, bring this to your office visits. Using a pill tender can help with taking your medications consistently.  Limit your fluid intake to 2 liters daily  Limit your sodium intake to less than 2-3 grams daily. Ask if you need dietary teaching.  If you gain more than 3 pounds (from your dry weight ), double your dose of diuretic for the day.  If you gain more than 5 pounds (from your dry weight), double your dose of lasix and call your heart failure doctor.  Please do not smoke tobacco since it is very bad for your heart.  Please do not drink alcohol since it can worsen your heart failure.Also avoid OTC nonsteroidal drugs, such as advil, aleve and motrin.  Try to exercise for at least 30 minutes every day because this will help your heart be more efficient. You  may be eligible for supervised cardiac rehab, ask your physician.

## 2017-11-16 ENCOUNTER — Other Ambulatory Visit: Payer: Self-pay

## 2017-11-16 MED ORDER — FUROSEMIDE 80 MG PO TABS: 80.0000 mg | ORAL_TABLET | Freq: Two times a day (BID) | ORAL | 3 refills | Status: DC

## 2017-11-26 ENCOUNTER — Ambulatory Visit (HOSPITAL_BASED_OUTPATIENT_CLINIC_OR_DEPARTMENT_OTHER)
Admission: RE | Admit: 2017-11-26 | Discharge: 2017-11-26 | Disposition: A | Discharge status: Home / Self Care | Payer: Medicare Other | Source: Ambulatory Visit | Attending: Cardiology | Admitting: Cardiology

## 2017-11-26 DIAGNOSIS — I083 Combined rheumatic disorders of mitral, aortic and tricuspid valves: Secondary | ICD-10-CM | POA: Insufficient documentation

## 2017-11-26 DIAGNOSIS — I5032 Chronic diastolic (congestive) heart failure: Secondary | ICD-10-CM | POA: Diagnosis present

## 2017-11-26 DIAGNOSIS — I4891 Unspecified atrial fibrillation: Secondary | ICD-10-CM | POA: Insufficient documentation

## 2017-11-26 NOTE — Progress Notes (Signed)
  Echocardiogram 2D Echocardiogram has been performed.  Sandy Hanson 11/26/2017, 4:06 PM

## 2018-06-01 NOTE — Progress Notes (Signed)
Cardiology Office Note:    Date:  06/02/2018   ID:  Sandy Hanson, DOB [birth year], MRN [id number]  PCP:  Raina Mina., MD  Cardiologist:  Shirlee More, MD    Referring MD: Raina Mina., MD    ASSESSMENT:    1. Paroxysmal atrial fibrillation (HCC)   2. On amiodarone therapy   3. Chronic diastolic heart failure ([place])   4. Hypertensive heart and chronic kidney disease with heart failure and stage 1 through stage 4 chronic kidney disease, or chronic kidney disease ([place])   5. Aortic stenosis, mild    PLAN:    In order of problems listed above:  1. Stable maintaining sinus rhythm continue low-dose amiodarone and is not anticoagulated with previous GI bleed.  She has no evidence of toxicity next visit we will check liver function TSH 2. Stable continue low-dose amiodarone 3. Heart failure stable compensated no edema New York Heart Association class I continue current loop diuretic sodium restriction check labs renal function potassium and proBNP for safety and efficacy 4. Stable blood pressure target continue current treatment including loop and distal diuretic recheck renal function 5. Stable plan repeat echocardiogram 1 year   Next appointment: 6 months   Medication Adjustments/Labs and Tests Ordered: Current medicines are reviewed at length with the patient today.  Concerns regarding medicines are outlined above.  Orders Placed This Encounter  Procedures  . Basic Metabolic Panel (BMET)  . Pro b natriuretic peptide (BNP)   No orders of the defined types were placed in this encounter.   Chief Complaint  Patient presents with  . Follow-up  . Atrial Fibrillation  . Congestive Heart Failure  . Aortic Stenosis    History of Present Illness:    Sandy Hanson is a 84 y.o. female with a hx of  mild AS diastolic CHF, paroxysmal Atrial Fibrillation  , Dyslipidemia, and stage 4 CKD  last seen 11/03/2017.  At that time she was continued on low-dose amiodarone and her  heart failure was compensated.  Echocardiogram performed after the visit showed normal left ventricular function ejection fraction 60 to 65% elevated left atrial pressure moderate aortic stenosis peak and mean gradients 45 and 24 mmHg and a VTI ratio of 0.38, moderate mitral regurgitation moderate left atrial enlargement and moderate tricuspid regurgitation Compliance with diet, lifestyle and medications: Yes  She continues to take great good care of herself her weights are stable heart rate and blood pressure in range and has had no shortness of breath palpitation edema orthopnea chest pain syncope or TIA.  She is quite pleased with the quality of her life Past Medical History:  Diagnosis Date  . Abnormal gait 10/17/2015   Last Assessment & Plan:  No recent fall, she has use of her cane and she has a supportive person with her typically to help her   . Anemia 02/20/2015   Overview:  Recurrent, recently required 5 units of PRBC's  . Aortic stenosis, mild 02/20/2015  . Aortic valve insufficiency 02/20/2015   Overview:  Overview:  mild  . Arteriovenous malformation 10/17/2015  . Asthma with COPD ([place]) 10/17/2015  . Asthma, moderate persistent    Amb saturation: Normal 02/14/2014 PFTs>> Severe restriction and obstruction:  FeV1 34% FVC 44%  TLC 40%  Post BD FeV1 29% improvement HRCT Chest >>NORMAL  RA, Allergy panel all normal 02/21/2014 HFA technique 03/28/2014 20% efficiency improved to 75% with coaching,  spacer device added HFA technique 04/04/2014 less than 20% efficiency even with spacer device,  the patient still tends to hold her breath for prolonged periods of time and does not take a deep breath in order to delivered drug into the lung   . Atrial fibrillation ([place]) 10/23/2014  . Cardiorenal syndrome with renal failure 03/07/2016  . Cardiorenal syndrome with renal failure 03/07/2016  . CHF (congestive heart failure) ([place]) 12/12/2014  . Chronic diastolic heart failure ([place]) 02/20/2015  . Chronic midline low  back pain without sciatica 11/05/2015   Last Assessment & Plan:  Relevant Hx: Course: Daily Update: Today's Plan:she is as well following with ortho for her back and has a corset she is wearing for comfort and she does feel it helps her for right now but she is frustrated with her continued inability to not be able to have her balance back yet since she fell. She is not using her walker and is using her friend 's arm and have advised she consider using the walker as it makes her walk and be more responsible with her gait.  Electronically signed by: Mayer Camel, NP 01/02/16 2130  . Chronic respiratory failure ([place]) 08/14/2016  . Colon polyps 10/17/2015  . Community acquired pneumonia of left upper lobe of lung ([place]) 08/05/2016  . Diverticulosis of colon 10/17/2015   Last Assessment & Plan:  She is eating fiber , she is not having bouts of signficant constipation though she takes the miralax about every other day if she needs to  . Dyspnea   . Gout   . Hallux limitus 10/17/2015  . High risk medication use 10/17/2015  . Hypertension   . Hypomagnesemia 10/17/2015  . Major depressive disorder with current active episode 04/15/2016   Last Assessment & Plan:  Will start her on low dose of the zoloft at 25 mg and will see how she does with this and have her repeat her BMP in one mnth to ensure her renal function is stable from this  . Medication reaction 04/13/2014  . Mixed hyperlipidemia 10/17/2015   Last Assessment & Plan:  She will return fasting for her labs to be done to see how she is doing  . Nonrheumatic aortic valve insufficiency 02/20/2015   Overview:  mild  . Obesity 10/17/2015  . Obesity (BMI 30-39.9) 04/09/2016   Last Assessment & Plan:  She was advised to work on her diet and her walking if at all possible , be careful with this, but she can effectively change some of her diet from this  . OSA treated with BiPAP 10/21/2016  . Pleural effusion 10/17/2015  . Postmenopausal 04/15/2016    Last Assessment & Plan:  Obtain dexa for her she requests RH  . Primary insomnia 10/17/2015   Last Assessment & Plan:  She feels this is stable for her at this time and will follow  . PVC (premature ventricular contraction) 10/17/2015  . Restrictive lung disease 10/21/2016    Past Surgical History:  Procedure Laterality Date  . ABLATION    . CATARACT EXTRACTION    . double knee surgery    . TONSILLECTOMY    . VAGINAL HYSTERECTOMY      Current Medications: Current Meds  Medication Sig  . albuterol (PROVENTIL) (2.5 MG/3ML) 0.083% nebulizer solution Take 3 mLs (2.5 mg total) by nebulization every 6 (six) hours as needed for wheezing or shortness of breath.  Marland Kitchen amiodarone (PACERONE) 200 MG tablet Take 200 mg by mouth daily.  Marland Kitchen atorvastatin (LIPITOR) 20 MG tablet Take 20 mg by mouth daily.  . budesonide (  PULMICORT) 0.25 MG/2ML nebulizer solution INHALE 1 VIAL VIA NEBULZIER TWICE DAILY  . Cholecalciferol (VITAMIN D3) 1000 units CAPS Take 1,000 Units by mouth daily.  . febuxostat (ULORIC) 40 MG tablet Take 40 mg by mouth daily.  . furosemide (LASIX) 80 MG tablet Take 1 tablet (80 mg total) by mouth 2 (two) times daily. Take an extra 1/2 tablet if you weigh 220 or greater  . Lactobacillus Rhamnosus, GG, (CULTURELLE PO) Take 1 tablet by mouth daily.  Marland Kitchen levothyroxine (SYNTHROID, LEVOTHROID) 75 MCG tablet Take 75 mcg by mouth daily.  . polyethylene glycol (MIRALAX / GLYCOLAX) packet Take 17 g by mouth daily as needed for mild constipation.  . potassium chloride (K-DUR,KLOR-CON) 10 MEQ tablet Take 1 tablet by mouth daily.  . sertraline (ZOLOFT) 50 MG tablet Take 50 mg by mouth daily.   Marland Kitchen spironolactone (ALDACTONE) 25 MG tablet Take 12.5 mg by mouth daily.     Allergies:   Amoxicillin-pot clavulanate and Penicillins   Social History   Socioeconomic History  . Marital status: Divorced    Spouse name: Not on file  . Number of children: Not on file  . Years of education: Not on file  . Highest  education level: Not on file  Occupational History  . Occupation: Retired    Comment: Research officer, political party Needs  . Financial resource strain: Not on file  . Food insecurity:    Worry: Not on file    Inability: Not on file  . Transportation needs:    Medical: Not on file    Non-medical: Not on file  Tobacco Use  . Smoking status: Never Smoker  . Smokeless tobacco: Never Used  Substance and Sexual Activity  . Alcohol use: No  . Drug use: No  . Sexual activity: Not on file  Lifestyle  . Physical activity:    Days per week: Not on file    Minutes per session: Not on file  . Stress: Not on file  Relationships  . Social connections:    Talks on phone: Not on file    Gets together: Not on file    Attends religious service: Not on file    Active member of club or organization: Not on file    Attends meetings of clubs or organizations: Not on file    Relationship status: Not on file  Other Topics Concern  . Not on file  Social History Narrative  . Not on file     Family History: The patient's family history includes Heart disease in her maternal aunt, mother, and paternal aunt; Hypertension in her mother; Lung cancer in her father. ROS:   Please see the history of present illness.    All other systems reviewed and are negative.  EKGs/Labs/Other Studies Reviewed:    The following studies were reviewed today:   Recent Labs: No results found for requested labs within last 8760 hours.  Recent Lipid Panel No results found for: CHOL, TRIG, HDL, CHOLHDL, VLDL, LDLCALC, LDLDIRECT  Physical Exam:    VS:  BP (!) 122/58 (BP Location: Left Arm, Patient Position: Sitting, Cuff Size: Normal)   Pulse (!) 52   Ht 5\' 4"  (1.626 m)   Wt 190 lb 12.8 oz (86.5 kg)   SpO2 97%   BMI 32.75 kg/m     Wt Readings from Last 3 Encounters:  06/02/18 190 lb 12.8 oz (86.5 kg)  11/03/17 210 lb (95.3 kg)  04/21/17 216 lb 6.4 oz (98.2 kg)  GEN:  Well nourished, well developed in no acute  distress HEENT: Normal NECK: No JVD; No carotid bruits LYMPHATICS: No lymphadenopathy CARDIAC: Grade 1/6 to 2/6 systolic ejection murmur harsh peaks late radiates to the clavicle but not to the carotids RRR, no murmurs, rubs, gallops RESPIRATORY:  Clear to auscultation without rales, wheezing or rhonchi  ABDOMEN: Soft, non-tender, non-distended MUSCULOSKELETAL:  No edema; No deformity  SKIN: Warm and dry NEUROLOGIC:  Alert and oriented x 3 PSYCHIATRIC:  Normal affect    Signed, Shirlee More, MD  06/02/2018 4:55 PM    [facility]

## 2018-06-02 ENCOUNTER — Ambulatory Visit: Payer: Medicare Other | Admitting: Cardiology

## 2018-06-02 ENCOUNTER — Encounter: Payer: Self-pay | Admitting: Cardiology

## 2018-06-02 VITALS — BP 122/58 | HR 52 | Ht 64.0 in | Wt 190.8 lb

## 2018-06-02 DIAGNOSIS — I130 Hypertensive heart and chronic kidney disease with heart failure and stage 1 through stage 4 chronic kidney disease, or unspecified chronic kidney disease: Secondary | ICD-10-CM | POA: Diagnosis not present

## 2018-06-02 DIAGNOSIS — Z79899 Other long term (current) drug therapy: Secondary | ICD-10-CM

## 2018-06-02 DIAGNOSIS — I480 Paroxysmal atrial fibrillation: Secondary | ICD-10-CM

## 2018-06-02 DIAGNOSIS — I5032 Chronic diastolic (congestive) heart failure: Secondary | ICD-10-CM | POA: Diagnosis not present

## 2018-06-02 DIAGNOSIS — I350 Nonrheumatic aortic (valve) stenosis: Secondary | ICD-10-CM

## 2018-06-02 NOTE — Patient Instructions (Addendum)
Medication Instructions:  Your physician recommends that you continue on your current medications as directed. Please refer to the Current Medication list given to you today.   Labwork: Your physician recommends that you return for lab work today: BMP, ProBNP.   Testing/Procedures: None  Follow-Up: Your physician wants you to follow-up in: 6 months. You will receive a reminder letter in the mail two months in advance. If you don't receive a letter, please call our office to schedule the follow-up appointment.   If you need a refill on your cardiac medications before your next appointment, please call your pharmacy.   Thank you for choosing [facility]! Robyne Peers, RN [phone]        Aortic Valve Stenosis Aortic valve stenosis is a narrowing of the aortic valve. The aortic valve opens and closes to regulate blood flow between the lower left chamber of the heart (left ventricle) and the blood vessel that leads away from the heart (aorta). When the aortic valve becomes narrow, it makes it difficult for the heart to pump blood into the aorta, which causes the heart to work harder. The extra work can weaken the heart over time. Aortic valve stenosis can range from mild to severe. If untreated, it can become more severe over time and can lead to heart failure. What are the causes? This condition may be caused by:  Buildup of calcium around and on the valve. This can occur with aging. This is the most common cause of aortic valve stenosis.  Birth defect.  Rheumatic fever.  Radiation to the chest.  What increases the risk? You may be more likely to develop this condition if:  You are over the age of 14.  You were born with an abnormal bicuspid valve.  What are the signs or symptoms? You may have no symptoms until your condition becomes severe. It may take 10-20 years for mild or moderate aortic valve stenosis to become severe. Symptoms may include:  Shortness  of breath. This may get worse during physical activity.  Feeling unusually weak and tired (fatigue).  Extreme discomfort in the chest, neck, or arm (angina).  A heartbeat that is irregular or faster than normal (palpitations).  Dizziness or fainting. This may happen when you get physically tired or after you take certain heart medicines, such as nitroglycerin.  How is this diagnosed? This condition may be diagnosed with:  A physical exam.  Echocardiogram. This is a type of imaging test that uses sound waves (ultrasound) to make an image of your heart. There are two types that may be used: ? Transthoracic echocardiogram (TTE). This type of echocardiogram is noninvasive, and it is usually done first. ? Transesophageal echocardiogram (TEE). This type of echocardiogram is done by passing a flexible tube down your esophagus. The heart and the esophagus are close to each other, so your health care provider can take very clear, detailed pictures of the heart using this type of test.   You may work with a health care provider who specializes in the heart (cardiologist). How is this treated? Treatment depends on how severe your condition is and what your symptoms are. You will need to have your heart checked regularly to make sure that your condition is not getting worse or causing serious problems. If your condition is mild, no treatment may be needed. Treatment may include:  Medicines that help keep your heart rate regular.  Medicines that thin your blood (anticoagulants) to prevent the formation of blood clots.  Antibiotic  medicines to help prevent infection.  Surgery to replace your aortic valve.only if severe and most done percutaneously-TAVR.  Follow these instructions at home: Lifestyle   Do not use any tobacco products, such as cigarettes, chewing tobacco, or e-cigarettes. If you need help quitting, ask your health care provider.  Work with your health care provider to manage  your blood pressure and cholesterol.  Maintain a healthy weight. Eating and drinking  Follow instructions from your health care provider about eating or drinking restrictions. ? Limit how much caffeine you drink. Caffeine can affect your heart's rate and rhythm.  Drink enough fluid to keep your urine clear or pale yellow.  Eat a heart-healthy diet. This should include plenty of fresh fruits and vegetables. If you eat meat, it should be lean cuts. Avoid foods that are: ? High in salt, saturated fat, or sugar. ? Canned or highly processed. ? Fried. Activity  Return to your normal activities as told by your health care provider. Ask your health care provider what activities are safe for you.  Exercise regularly, as told by your health care provider. Ask your health care provider what types of exercise are safe for you.  If your aortic valve stenosis is mild, you may need to avoid only very intense physical activity. The more severe your aortic valve stenosis is, the more activities you may need to avoid. General instructions  Take over-the-counter and prescription medicines only as told by your health care provider.  If you are a woman and you plan to become pregnant, talk with your health care provider before you become pregnant.  Tell all health care providers who care for you that you have aortic valve stenosis.  Keep all follow-up visits as told by your health care provider. This is important. Contact a health care provider if:  You have a fever. Get help right away if:  You develop chest pain or tightness.  You develop shortness of breath or difficulty breathing.  You feel light-headed.  You feel like you might faint.  Your heartbeat is irregular or faster than normal. These symptoms may represent a serious problem that is an emergency. Do not wait to see if the symptoms will go away. Get medical help right away. Call your local emergency services (911 in the [place] Do  not drive yourself to the hospital. This information is not intended to replace advice given to you by your health care provider. Make sure you discuss any questions you have with your health care provider. Document Released: 05/17/2003 Document Revised: 01/24/2016 Document Reviewed: 07/22/2015 Elsevier Interactive Patient Education  2017 [employer].

## 2018-06-03 LAB — BASIC METABOLIC PANEL
BUN/Creatinine Ratio: 29 — ABNORMAL HIGH (ref 12–28)
BUN: 53 mg/dL — AB (ref 8–27)
CALCIUM: 9.2 mg/dL (ref 8.7–10.3)
CO2: 24 mmol/L (ref 20–29)
CREATININE: 1.83 mg/dL — AB (ref 0.57–1.00)
Chloride: 103 mmol/L (ref 96–106)
GFR calc Af Amer: 29 mL/min/{1.73_m2} — ABNORMAL LOW (ref >59)
GFR calc non Af Amer: 25 mL/min/{1.73_m2} — ABNORMAL LOW (ref >59)
GLUCOSE: 95 mg/dL (ref 65–99)
Potassium: 4.3 mmol/L (ref 3.5–5.2)
Sodium: 143 mmol/L (ref 134–144)

## 2018-06-03 LAB — PRO B NATRIURETIC PEPTIDE: NT-PRO BNP: 648 pg/mL (ref 0–738)

## 2018-08-17 DIAGNOSIS — Z1231 Encounter for screening mammogram for malignant neoplasm of breast: Secondary | ICD-10-CM | POA: Insufficient documentation

## 2018-08-17 DIAGNOSIS — I771 Stricture of artery: Secondary | ICD-10-CM | POA: Insufficient documentation

## 2018-08-23 ENCOUNTER — Other Ambulatory Visit: Payer: Self-pay | Admitting: Specialist

## 2018-08-23 DIAGNOSIS — Z1231 Encounter for screening mammogram for malignant neoplasm of breast: Secondary | ICD-10-CM

## 2018-09-21 ENCOUNTER — Other Ambulatory Visit: Payer: Self-pay | Admitting: Specialist

## 2018-09-21 DIAGNOSIS — Z1231 Encounter for screening mammogram for malignant neoplasm of breast: Secondary | ICD-10-CM

## 2018-10-15 ENCOUNTER — Ambulatory Visit
Admission: RE | Admit: 2018-10-15 | Discharge: 2018-10-15 | Disposition: A | Discharge status: Home / Self Care | Payer: Medicare Other | Source: Ambulatory Visit | Attending: Specialist | Admitting: Specialist

## 2018-10-15 DIAGNOSIS — Z1231 Encounter for screening mammogram for malignant neoplasm of breast: Secondary | ICD-10-CM

## 2018-10-20 ENCOUNTER — Other Ambulatory Visit: Payer: Self-pay | Admitting: Specialist

## 2018-10-20 DIAGNOSIS — R928 Other abnormal and inconclusive findings on diagnostic imaging of breast: Secondary | ICD-10-CM

## 2018-10-26 ENCOUNTER — Ambulatory Visit
Admission: RE | Admit: 2018-10-26 | Discharge: 2018-10-26 | Disposition: A | Discharge status: Home / Self Care | Payer: Medicare Other | Source: Ambulatory Visit | Attending: Specialist | Admitting: Specialist

## 2018-10-26 ENCOUNTER — Ambulatory Visit: Payer: Medicare Other

## 2018-10-26 DIAGNOSIS — R928 Other abnormal and inconclusive findings on diagnostic imaging of breast: Secondary | ICD-10-CM

## 2018-11-02 ENCOUNTER — Other Ambulatory Visit: Payer: Self-pay | Admitting: Cardiology

## 2018-11-23 ENCOUNTER — Telehealth: Payer: Self-pay | Admitting: Cardiology

## 2018-11-23 NOTE — Telephone Encounter (Signed)
Patient was on the schedule for the 30th and will be willing to do teleconference.

## 2018-11-23 NOTE — Telephone Encounter (Signed)
Noted  

## 2018-11-26 ENCOUNTER — Telehealth: Payer: Self-pay | Admitting: Cardiology

## 2018-11-26 NOTE — Telephone Encounter (Signed)
Cardiac Questionnaire:    Since your last visit or hospitalization:    1. Have you been having new or worsening chest pain? no   2. Have you been having new or worsening shortness of breath? no 3. Have you been having new or worsening leg swelling, wt gain, or increase in abdominal girth (pants fitting more tightly)? no   4. Have you had any passing out spells? no    *A YES to any of these questions would result in the appointment being kept. *If all the answers to these questions are NO, we should indicate that given the current situation regarding the worldwide coronarvirus pandemic, at the recommendation of the CDC, we are looking to limit gatherings in our waiting area, and thus will reschedule their appointment beyond four weeks from today.   _____________   XIPJA-25 Pre-Screening Questions:   Do you currently have a fever? no  Have you recently travelled on a cruise, internationally, or to [place], [place], [place], or [place], [place]) ? no  Have you been in contact with someone that is currently pending confirmation of Covid19 testing or has been confirmed to have the [place] virus?  no  Are you currently experiencing fatigue or cough? no         YOUR CARDIOLOGY TEAM HAS ARRANGED FOR AN E-VISIT FOR YOUR APPOINTMENT - PLEASE REVIEW IMPORTANT INFORMATION BELOW SEVERAL DAYS PRIOR TO YOUR APPOINTMENT  Due to the recent COVID-19 pandemic, we are transitioning in-person office visits to tele-medicine visits in an effort to decrease unnecessary exposure to our patients and staff. Medicare and most insurances are covering these visits without a copay needed. You will need a smartphone if possible. For patients that do not have these items, we can still complete the visit using a telephone but do prefer a smartphone to enable video when possible. You may have a close family member that can help. If possible, we also ask that you have a blood pressure cuff and scale at home to  measure your blood pressure, heart rate and weight prior to your scheduled appointment. Patients with clinical needs that need an in-person evaluation and testing will still be able to come to the office if absolutely necessary. If you have any questions, feel free to call our office.     DOWNLOADING [place], go to App Store and type in WebEx in the search bar. [place] [employer], the blue/green circle. The app is free but as with any other app download, your phone may require you to verify saved payment information or Apple password. You do NOT have to create a WebEx account.  - If Android, go to [employer] and type in [employer] in the search bar. [place] [employer], the blue/green circle. The app is free but as with any other app download, your phone may require you to verify saved payment information or Android password. You do NOT have to create a WebEx account.  It is very helpful to have this downloaded before your visit.    2-3 DAYS BEFORE YOUR APPOINTMENT  You will receive a telephone call from one of our [facility] team members - your caller ID may say "Unknown caller." If this is a video visit, we will confirm that you have been able to download the WebEx app. We will remind you check your blood pressure, heart rate and weight prior to your scheduled appointment. If you have an Apple  Watch or Kardia, please upload any pertinent ECG strips the day before or morning of your appointment to [place]. Our staff will also make sure you have reviewed the consent and agree to move forward with your scheduled tele-health visit.     THE DAY OF YOUR APPOINTMENT  Approximately 15 minutes prior to your scheduled appointment, you will receive a telephone call from one of [facility] team - your caller ID may say "Unknown caller."  Our staff will confirm medications, vital signs for the day and any symptoms you may be experiencing. Please  have this information available prior to the time of visit start. It may also be helpful for you to have a pad of paper and pen handy for any instructions given during your visit. They will also walk you through joining the WebEx smartphone meeting if this is a video visit.    CONSENT FOR TELE-HEALTH VISIT - PLEASE RVIEW  I hereby voluntarily request, consent and authorize [facility] and its employed or contracted physicians, physician assistants, nurse practitioners or other licensed health care professionals (the Practitioner), to provide me with telemedicine health care services (the Services") as deemed necessary by the treating Practitioner. I acknowledge and consent to receive the Services by the Practitioner via telemedicine. I understand that the telemedicine visit will involve communicating with the Practitioner through live audiovisual communication technology and the disclosure of certain medical information by electronic transmission. I acknowledge that I have been given the opportunity to request an in-person assessment or other available alternative prior to the telemedicine visit and am voluntarily participating in the telemedicine visit.  I understand that I have the right to withhold or withdraw my consent to the use of telemedicine in the course of my care at any time, without affecting my right to future care or treatment, and that the Practitioner or I may terminate the telemedicine visit at any time. I understand that I have the right to inspect all information obtained and/or recorded in the course of the telemedicine visit and may receive copies of available information for a reasonable fee.  I understand that some of the potential risks of receiving the Services via telemedicine include:   Delay or interruption in medical evaluation due to technological equipment failure or disruption;  Information transmitted may not be sufficient (e.g. poor resolution of images) to allow  for appropriate medical decision making by the Practitioner; and/or   In rare instances, security protocols could fail, causing a breach of personal health information.  Furthermore, I acknowledge that it is my responsibility to provide information about my medical history, conditions and care that is complete and accurate to the best of my ability. I acknowledge that Practitioner's advice, recommendations, and/or decision may be based on factors not within their control, such as incomplete or inaccurate data provided by me or distortions of diagnostic images or specimens that may result from electronic transmissions. I understand that the practice of medicine is not an exact science and that Practitioner makes no warranties or guarantees regarding treatment outcomes. I acknowledge that I will receive a copy of this consent concurrently upon execution via email to the email address I last provided but may also request a printed copy by calling the office of [facility].    I understand that my insurance will be billed for this visit.   I have read or had this consent read to me.  I understand the contents of this consent, which adequately explains the benefits and risks of the  Services being provided via telemedicine.   I have been provided ample opportunity to ask questions regarding this consent and the Services and have had my questions answered to my satisfaction.  I give my informed consent for the services to be provided through the use of telemedicine in my medical care  By participating in this telemedicine visit I agree to the above. Patient gave verbal consent for this televisit

## 2018-11-29 ENCOUNTER — Telehealth (INDEPENDENT_AMBULATORY_CARE_PROVIDER_SITE_OTHER): Payer: Medicare Other | Admitting: Cardiology

## 2018-11-29 ENCOUNTER — Other Ambulatory Visit: Payer: Self-pay

## 2018-11-29 ENCOUNTER — Ambulatory Visit: Payer: Medicare Other | Admitting: Cardiology

## 2018-11-29 ENCOUNTER — Encounter: Payer: Self-pay | Admitting: Cardiology

## 2018-11-29 VITALS — Wt 185.2 lb

## 2018-11-29 DIAGNOSIS — Z79899 Other long term (current) drug therapy: Secondary | ICD-10-CM

## 2018-11-29 DIAGNOSIS — N184 Chronic kidney disease, stage 4 (severe): Secondary | ICD-10-CM | POA: Diagnosis not present

## 2018-11-29 DIAGNOSIS — I130 Hypertensive heart and chronic kidney disease with heart failure and stage 1 through stage 4 chronic kidney disease, or unspecified chronic kidney disease: Secondary | ICD-10-CM

## 2018-11-29 DIAGNOSIS — I5030 Unspecified diastolic (congestive) heart failure: Secondary | ICD-10-CM

## 2018-11-29 DIAGNOSIS — I350 Nonrheumatic aortic (valve) stenosis: Secondary | ICD-10-CM | POA: Diagnosis not present

## 2018-11-29 DIAGNOSIS — I340 Nonrheumatic mitral (valve) insufficiency: Secondary | ICD-10-CM

## 2018-11-29 DIAGNOSIS — I5032 Chronic diastolic (congestive) heart failure: Secondary | ICD-10-CM

## 2018-11-29 NOTE — Progress Notes (Signed)
Virtual Visit via Telephone Note    Evaluation Performed:  Follow-up visit  This visit type was conducted due to national recommendations for restrictions regarding the COVID-19 Pandemic (e.g. social distancing).  This format is felt to be most appropriate for this patient at this time.  All issues noted in this document were discussed and addressed.  No physical exam was performed (except for noted visual exam findings with Video Visits).  Please refer to the patient's chart (MyChart message for video visits and phone note for telephone visits) for the patient's consent to telehealth for [facility].  Date:  11/29/2018   ID:  Sandy Hanson, DOB Sep 11, 1932, MRN [id number]  Patient Location:  Her home  Provider location:   [place]  PCP:  Raina Mina., MD  Cardiologist:  No primary care provider on file.  Dr. Bettina Gavia Electrophysiologist:  None   Chief Complaint: Cardiology follow-up for heart failure hypertension valvular heart disease atrial fibrillation and amiodarone therapy  History of Present Illness:    Sandy Hanson is a 85 y.o. female who presents via audio/video conferencing for a telehealth visit today.    The patient does not symptoms concerning for COVID-19 infection (fever, chills, cough, or new shortness of breath).   Is a history of AS diastolic CHF, paroxysmal Atrial Fibrillation  , Dyslipidemia, and stage 4 CKD  last seen 11/03/2017.  At that time she was continued on low-dose amiodarone and her heart failure was compensated.  Echocardiogram performed after the visit showed normal left ventricular function ejection fraction 60 to 65% elevated left atrial pressure moderate aortic stenosis peak and mean gradients 45 and 24 mmHg and a VTI ratio of 0.38, moderate mitral regurgitation moderate left atrial enlargement and moderate tricuspid regurgitation she is seen by [facility] nephrology 11/08/2018 laboratory studies showed worsened GFR  creatinine 2.66 potassium 3.9 cholesterol 138 LDL 74 HDL 55 TSH normal hemoglobin 10.7.  Her diuretic dose was decreased to 50% since then her weight is stable she has had no edema shortness of breath chest pain palpitation or syncope.  No fever cough or sputum production.  She has had no exposure to COVID-19.  Her weights at home have run between 184 to 186 pounds and she cannot locate her blood pressure list but tells me all numbers have been less than 902 systolic.  She has a caregiver present who supervises tells me she has no neck vein distention edema skin is warm and dry.  There is no indication that her heart failure is decompensated since diuretics were decreased  Prior CV studies:   The following studies were reviewed today:    Past Medical History:  Diagnosis Date   Abnormal gait 10/17/2015   Last Assessment & Plan:  No recent fall, she has use of her cane and she has a supportive person with her typically to help her    Anemia 02/20/2015   Overview:  Recurrent, recently required 5 units of PRBC's   Aortic stenosis, mild 02/20/2015   Aortic valve insufficiency 02/20/2015   Overview:  Overview:  mild   Arteriovenous malformation 10/17/2015   Asthma with COPD ([place]) 10/17/2015   Asthma, moderate persistent    Amb saturation: Normal 02/14/2014 PFTs>> Severe restriction and obstruction:  FeV1 34% FVC 44%  TLC 40%  Post BD FeV1 29% improvement HRCT Chest >>NORMAL  RA, Allergy panel all normal 02/21/2014 HFA technique 03/28/2014 20% efficiency improved to 75% with coaching,  spacer device added HFA technique 04/04/2014  less than 20% efficiency even with spacer device, the patient still tends to hold her breath for prolonged periods of time and does not take a deep breath in order to delivered drug into the lung    Atrial fibrillation ([place]) 10/23/2014   Cardiorenal syndrome with renal failure 03/07/2016   Cardiorenal syndrome with renal failure 03/07/2016   CHF (congestive heart failure) ([place])  12/12/2014   Chronic diastolic heart failure ([place]) 02/20/2015   Chronic midline low back pain without sciatica 11/05/2015   Last Assessment & Plan:  Relevant Hx: Course: Daily Update: Today's Plan:she is as well following with ortho for her back and has a corset she is wearing for comfort and she does feel it helps her for right now but she is frustrated with her continued inability to not be able to have her balance back yet since she fell. She is not using her walker and is using her friend 's arm and have advised she consider using the walker as it makes her walk and be more responsible with her gait.  Electronically signed by: Mayer Camel, NP 01/02/16 2130   Chronic respiratory failure ([place]) 08/14/2016   Colon polyps 10/17/2015   Community acquired pneumonia of left upper lobe of lung ([place]) 08/05/2016   Diverticulosis of colon 10/17/2015   Last Assessment & Plan:  She is eating fiber , she is not having bouts of signficant constipation though she takes the miralax about every other day if she needs to   Dyspnea    Gout    Hallux limitus 10/17/2015   High risk medication use 10/17/2015   Hypertension    Hypomagnesemia 10/17/2015   Major depressive disorder with current active episode 04/15/2016   Last Assessment & Plan:  Will start her on low dose of the zoloft at 25 mg and will see how she does with this and have her repeat her BMP in one mnth to ensure her renal function is stable from this   Medication reaction 04/13/2014   Mixed hyperlipidemia 10/17/2015   Last Assessment & Plan:  She will return fasting for her labs to be done to see how she is doing   Nonrheumatic aortic valve insufficiency 02/20/2015   Overview:  mild   Obesity 10/17/2015   Obesity (BMI 30-39.9) 04/09/2016   Last Assessment & Plan:  She was advised to work on her diet and her walking if at all possible , be careful with this, but she can effectively change some of her diet from this   OSA treated  with BiPAP 10/21/2016   Pleural effusion 10/17/2015   Postmenopausal 04/15/2016   Last Assessment & Plan:  Obtain dexa for her she requests RH   Primary insomnia 10/17/2015   Last Assessment & Plan:  She feels this is stable for her at this time and will follow   PVC (premature ventricular contraction) 10/17/2015   Restrictive lung disease 10/21/2016   Past Surgical History:  Procedure Laterality Date   ABLATION     CATARACT EXTRACTION     double knee surgery     TONSILLECTOMY     VAGINAL HYSTERECTOMY       Current Meds  Medication Sig   albuterol (PROVENTIL) (2.5 MG/3ML) 0.083% nebulizer solution Take 3 mLs (2.5 mg total) by nebulization every 6 (six) hours as needed for wheezing or shortness of breath.   amiodarone (PACERONE) 200 MG tablet Take 200 mg by mouth daily.   atorvastatin (LIPITOR) 20 MG tablet Take 20 mg  by mouth daily.   budesonide (PULMICORT) 0.25 MG/2ML nebulizer solution INHALE 1 VIAL VIA NEBULZIER TWICE DAILY   Cholecalciferol (VITAMIN D3) 1000 units CAPS Take 1,000 Units by mouth daily.   febuxostat (ULORIC) 40 MG tablet Take 40 mg by mouth daily.   furosemide (LASIX) 40 MG tablet Take 40 mg by mouth 2 (two) times daily.   Lactobacillus Rhamnosus, GG, (CULTURELLE PO) Take 1 tablet by mouth daily.   levothyroxine (SYNTHROID, LEVOTHROID) 75 MCG tablet Take 75 mcg by mouth daily.   montelukast (SINGULAIR) 10 MG tablet TAKE ONE TABLET EVERY DAY   polyethylene glycol (MIRALAX / GLYCOLAX) packet Take 17 g by mouth daily as needed for mild constipation.   potassium chloride (K-DUR,KLOR-CON) 10 MEQ tablet Take 1 tablet by mouth daily.   sertraline (ZOLOFT) 50 MG tablet Take 50 mg by mouth daily.    spironolactone (ALDACTONE) 25 MG tablet Take 12.5 mg by mouth daily.     Allergies:   Amoxicillin-pot clavulanate and Penicillins   Social History   Tobacco Use   Smoking status: Never Smoker   Smokeless tobacco: Never Used  Substance Use Topics    Alcohol use: No   Drug use: No     Family Hx: The patient's family history includes Heart disease in her maternal aunt, mother, and paternal aunt; Hypertension in her mother; Lung cancer in her father.  ROS:   Please see the history of present illness.     All other systems reviewed and are negative.   Labs/Other Tests and Data Reviewed:    Recent Labs: 06/02/2018: BUN 53; Creatinine, Ser 1.83; NT-Pro BNP 648; Potassium 4.3; Sodium 143   Recent Lipid Panel No results found for: CHOL, TRIG, HDL, CHOLHDL, LDLCALC, LDLDIRECT  Wt Readings from Last 3 Encounters:  11/29/18 185 lb 3.2 oz (84 kg)  06/02/18 190 lb 12.8 oz (86.5 kg)  11/03/17 210 lb (95.3 kg)     Exam:    Vital Signs:  Wt 185 lb 3.2 oz (84 kg)    BMI 31.79 kg/m     ASSESSMENT & PLAN:    1.  Aortic stenosis remains asymptomatic New York Heart Association class I she will require repeat echocardiogram about the time of her next visit at this time there is no indication for intervention.  She will follow endocarditis prophylaxis if needed. 2.  Heart failure chronic diastolic New York Heart Association class I her weight is stable continue her current diuretic but I told her and her caregiver for weights rise she will require increasing her Lasix back to 80 mg twice daily there is concern should be deteriorate with a decrease diuretic directed by nephrology 3.  Hypertension stable blood pressure target continue current treatment with her diuretics furosemide and spironolactone CKD is worsened managed by nephrology who is kept her on a distal diuretic despite severe CKD 4.  Paroxysmal atrial fibrillation stable maintaining sinus rhythm continue amiodarone and not anticoagulated with major bleeding complication in the past she has no evidence of toxicity clinically or on recent lab studies.  We will need to consider chest x-ray at the next visit 5.  On amiodarone continue low-dose amiodarone check chest x-ray next visit 6.   Mitral regurgitation moderate stable asymptomatic New York Heart Association class I 7.  Hyperlipidemia continue her statin  COVID-19 Education: The signs and symptoms of COVID-19 were discussed with the patient and how to seek care for testing (follow up with PCP or arrange E-visit).  The importance of social  distancing was discussed today.  Patient Risk:   After full review of this patients clinical status, I feel that they are at least moderate risk at this time.  Time:   Today, I have spent 22 minutes with the patient with telehealth technology discussing recent med changes review of notes from nephrology recent labs discussion with her caregiver regarding home self-monitoring blood pressure weight heart failure management diuretics both loop and distal ongoing amiodarone therapy for atrial fibrillation screening for toxicity valvular heart disease with moderate mitral and moderate aortic stenosis requiring repeat echocardiogram next visit and medical treatment for hyperlipidemia..     Medication Adjustments/Labs and Tests Ordered: Current medicines are reviewed at length with the patient today.  Concerns regarding medicines are outlined above.  Tests Ordered: No orders of the defined types were placed in this encounter.  Medication Changes: No orders of the defined types were placed in this encounter.   Disposition:  Follow up in 3 month(s)  Signed, Shirlee More, MD  11/29/2018 9:33 AM    [facility]

## 2018-11-29 NOTE — Patient Instructions (Signed)

## 2019-03-07 ENCOUNTER — Ambulatory Visit: Payer: Medicare Other | Admitting: Cardiology

## 2019-03-08 NOTE — Progress Notes (Signed)
Cardiology Office Note:    Date:  03/09/2019   ID:  Sandy Hanson, DOB [birth year], MRN [id number]  PCP:  Raina Mina., MD  Cardiologist:  Shirlee More, MD    Referring MD: Raina Mina., MD   Please draw TSH and a CMP with her next labs at her PCP office   ASSESSMENT:    1. Paroxysmal atrial fibrillation (HCC)   2. On amiodarone therapy   3. Chronic diastolic heart failure ([place])   4. Hypertensive heart and chronic kidney disease with heart failure and stage 1 through stage 4 chronic kidney disease, or chronic kidney disease (HCC)   5. Stage 4 chronic kidney disease ([place])   6. Sinus bradycardia   7. Aortic stenosis, moderate    PLAN:    In order of problems listed above:  1. Atrial fibrillation stable continue low-dose amiodarone she is not anticoagulated because of previous major hemorrhagic complication.  She has coincident sick sinus syndrome sinus bradycardia but I would not withdraw her amiodarone in view of her previous profound clinical deterioration with atrial fibrillation. 2. Heart failure compensated continue her current reduced diuretic and if her weight rises 188 pounds or greater take an extra tablet.  We are trying to precariously balance heart failure and severe CKD 3. Hypertension stable BP at target in her case with age and heart disease 1 78-2 50 systolic. 4. Stage IV CKD managed by nephrology she will be seen in the next 1 to 2 months 5. Sinus bradycardia stable continue reduced dose anti arrhythmic drug and avoid rate limiting beta-blocker 6. Moderate aortic stenosis repeat echocardiogram around the time of her next visit   Next appointment: 3 months   Medication Adjustments/Labs and Tests Ordered: Current medicines are reviewed at length with the patient today.  Concerns regarding medicines are outlined above.  No orders of the defined types were placed in this encounter.  No orders of the defined types were placed in this encounter.   No  chief complaint on file.   History of Present Illness:    Sandy Hanson is a 85 y.o. female with a hx of  mild AS diastolic CHF, paroxysmal Atrial Fibrillation  , Dyslipidemia, and stage 4 CKD  last seen 11/03/2017.  At that time she was continued on low-dose amiodarone and her heart failure was compensated.  Echocardiogram performed after the visit showed normal left ventricular function ejection fraction 60 to 65% elevated left atrial pressure moderate aortic stenosis peak and mean gradients 45 and 24 mmHg and a VTI ratio of 0.38, moderate mitral regurgitation moderate left atrial enlargement and moderate tricuspid regurgitation.She was last seen 05/01/2019 with a virtual visit with plans to repeat echocardiogram at this time.  She is maintaining sinus rhythm with her paroxysmal atrial fibrillation was continued on low-dose amiodarone.  Her heart failure was compensators. Compliance with diet, lifestyle and medications: Yes  Although her diuretic dosage was decreased her weights have been stable she has no edema orthopnea shortness of breath chest pain palpitation or syncope.  She has meticulously followed by her friend and aide at home.  Home blood pressures: Consistently less than 1 95-6 50 systolic.  Past Medical History:  Diagnosis Date  . Abnormal gait 10/17/2015   Last Assessment & Plan:  No recent fall, she has use of her cane and she has a supportive person with her typically to help her   . Anemia 02/20/2015   Overview:  Recurrent, recently required 5 units of PRBC's  .  Aortic stenosis, mild 02/20/2015  . Aortic valve insufficiency 02/20/2015   Overview:  Overview:  mild  . Arteriovenous malformation 10/17/2015  . Asthma with COPD ([place]) 10/17/2015  . Asthma, moderate persistent    Amb saturation: Normal 02/14/2014 PFTs>> Severe restriction and obstruction:  FeV1 34% FVC 44%  TLC 40%  Post BD FeV1 29% improvement HRCT Chest >>NORMAL  RA, Allergy panel all normal 02/21/2014 HFA technique  03/28/2014 20% efficiency improved to 75% with coaching,  spacer device added HFA technique 04/04/2014 less than 20% efficiency even with spacer device, the patient still tends to hold her breath for prolonged periods of time and does not take a deep breath in order to delivered drug into the lung   . Atrial fibrillation ([place]) 10/23/2014  . Cardiorenal syndrome with renal failure 03/07/2016  . Cardiorenal syndrome with renal failure 03/07/2016  . CHF (congestive heart failure) ([place]) 12/12/2014  . Chronic diastolic heart failure ([place]) 02/20/2015  . Chronic midline low back pain without sciatica 11/05/2015   Last Assessment & Plan:  Relevant Hx: Course: Daily Update: Today's Plan:she is as well following with ortho for her back and has a corset she is wearing for comfort and she does feel it helps her for right now but she is frustrated with her continued inability to not be able to have her balance back yet since she fell. She is not using her walker and is using her friend 's arm and have advised she consider using the walker as it makes her walk and be more responsible with her gait.  Electronically signed by: Mayer Camel, NP 01/02/16 2130  . Chronic respiratory failure ([place]) 08/14/2016  . Colon polyps 10/17/2015  . Community acquired pneumonia of left upper lobe of lung ([place]) 08/05/2016  . Diverticulosis of colon 10/17/2015   Last Assessment & Plan:  She is eating fiber , she is not having bouts of signficant constipation though she takes the miralax about every other day if she needs to  . Dyspnea   . Gout   . Hallux limitus 10/17/2015  . High risk medication use 10/17/2015  . Hypertension   . Hypomagnesemia 10/17/2015  . Major depressive disorder with current active episode 04/15/2016   Last Assessment & Plan:  Will start her on low dose of the zoloft at 25 mg and will see how she does with this and have her repeat her BMP in one mnth to ensure her renal function is stable from this  .  Medication reaction 04/13/2014  . Mixed hyperlipidemia 10/17/2015   Last Assessment & Plan:  She will return fasting for her labs to be done to see how she is doing  . Nonrheumatic aortic valve insufficiency 02/20/2015   Overview:  mild  . Obesity 10/17/2015  . Obesity (BMI 30-39.9) 04/09/2016   Last Assessment & Plan:  She was advised to work on her diet and her walking if at all possible , be careful with this, but she can effectively change some of her diet from this  . OSA treated with BiPAP 10/21/2016  . Pleural effusion 10/17/2015  . Postmenopausal 04/15/2016   Last Assessment & Plan:  Obtain dexa for her she requests RH  . Primary insomnia 10/17/2015   Last Assessment & Plan:  She feels this is stable for her at this time and will follow  . PVC (premature ventricular contraction) 10/17/2015  . Restrictive lung disease 10/21/2016    Past Surgical History:  Procedure Laterality Date  .  ABLATION    . CATARACT EXTRACTION    . double knee surgery    . TONSILLECTOMY    . VAGINAL HYSTERECTOMY      Current Medications: Current Meds  Medication Sig  . amiodarone (PACERONE) 200 MG tablet Take 200 mg by mouth daily.  Marland Kitchen atorvastatin (LIPITOR) 20 MG tablet Take 20 mg by mouth daily.  . budesonide (PULMICORT) 0.25 MG/2ML nebulizer solution INHALE 1 VIAL VIA NEBULZIER TWICE DAILY  . Cholecalciferol (VITAMIN D3) 1000 units CAPS Take 1,000 Units by mouth daily.  . febuxostat (ULORIC) 40 MG tablet Take 40 mg by mouth daily.  . furosemide (LASIX) 40 MG tablet Take 40 mg by mouth 2 (two) times daily.  Marland Kitchen levothyroxine (SYNTHROID, LEVOTHROID) 75 MCG tablet Take 75 mcg by mouth daily.  . montelukast (SINGULAIR) 10 MG tablet TAKE ONE TABLET EVERY DAY  . polyethylene glycol (MIRALAX / GLYCOLAX) packet Take 17 g by mouth daily as needed for mild constipation.  . potassium chloride (K-DUR,KLOR-CON) 10 MEQ tablet Take 1 tablet by mouth daily.  . sertraline (ZOLOFT) 50 MG tablet Take 50 mg by mouth daily.   Marland Kitchen  spironolactone (ALDACTONE) 25 MG tablet Take 12.5 mg by mouth daily.     Allergies:   Amoxicillin-pot clavulanate and Penicillins   Social History   Socioeconomic History  . Marital status: Divorced    Spouse name: Not on file  . Number of children: Not on file  . Years of education: Not on file  . Highest education level: Not on file  Occupational History  . Occupation: Retired    Comment: Research officer, political party Needs  . Financial resource strain: Not on file  . Food insecurity    Worry: Not on file    Inability: Not on file  . Transportation needs    Medical: Not on file    Non-medical: Not on file  Tobacco Use  . Smoking status: Never Smoker  . Smokeless tobacco: Never Used  Substance and Sexual Activity  . Alcohol use: No  . Drug use: No  . Sexual activity: Not on file  Lifestyle  . Physical activity    Days per week: Not on file    Minutes per session: Not on file  . Stress: Not on file  Relationships  . Social Herbalist on phone: Not on file    Gets together: Not on file    Attends religious service: Not on file    Active member of club or organization: Not on file    Attends meetings of clubs or organizations: Not on file    Relationship status: Not on file  Other Topics Concern  . Not on file  Social History Narrative  . Not on file     Family History: The patient's family history includes Heart disease in her maternal aunt, mother, and paternal aunt; Hypertension in her mother; Lung cancer in her father. ROS:   Please see the history of present illness.    All other systems reviewed and are negative.  EKGs/Labs/Other Studies Reviewed:    The following studies were reviewed today:  EKG:  EKG ordered today and personally reviewed.  The ekg ordered today demonstrates sinus bradycardia 48 bpm incomplete right bundle branch block left axis deviation  Recent Labs: 11/08/2018: Creatinine 2.66 potassium 3.9 hemoglobin 10.7 08/17/2018 cholesterol  138 LDL 74 HDL 55 TSH 4.76  06/02/2018: BUN 53; Creatinine, Ser 1.83; NT-Pro BNP 648; Potassium 4.3; Sodium 143  Recent Lipid Panel  No results found for: CHOL, TRIG, HDL, CHOLHDL, VLDL, LDLCALC, LDLDIRECT  Physical Exam:    VS:  BP (!) 112/50 (BP Location: Left Arm, Patient Position: Sitting, Cuff Size: Normal)   Pulse (!) 48   Ht 5\' 4"  (1.626 m)   Wt 184 lb (83.5 kg)   SpO2 97%   BMI 31.58 kg/m     Wt Readings from Last 3 Encounters:  03/09/19 184 lb (83.5 kg)  11/29/18 185 lb 3.2 oz (84 kg)  06/02/18 190 lb 12.8 oz (86.5 kg)     GEN:  Well nourished, well developed in no acute distress HEENT: Normal NECK: No JVD; No carotid bruits LYMPHATICS: No lymphadenopathy CARDIAC: 3 of 6 harsh aortic stenosis murmur to the carotids S2 is single RRR, no , rubs, gallops RESPIRATORY:  Clear to auscultation without rales, wheezing or rhonchi  ABDOMEN: Soft, non-tender, non-distended MUSCULOSKELETAL:  No edema; No deformity  SKIN: Warm and dry NEUROLOGIC:  Alert and oriented x 3 PSYCHIATRIC:  Normal affect    Signed, Shirlee More, MD  03/09/2019 5:10 PM    [facility]

## 2019-03-09 ENCOUNTER — Encounter: Payer: Self-pay | Admitting: Cardiology

## 2019-03-09 ENCOUNTER — Other Ambulatory Visit: Payer: Self-pay

## 2019-03-09 ENCOUNTER — Ambulatory Visit (INDEPENDENT_AMBULATORY_CARE_PROVIDER_SITE_OTHER): Payer: Medicare Other | Admitting: Cardiology

## 2019-03-09 VITALS — BP 112/50 | HR 48 | Ht 64.0 in | Wt 184.0 lb

## 2019-03-09 DIAGNOSIS — Z79899 Other long term (current) drug therapy: Secondary | ICD-10-CM

## 2019-03-09 DIAGNOSIS — I350 Nonrheumatic aortic (valve) stenosis: Secondary | ICD-10-CM

## 2019-03-09 DIAGNOSIS — I480 Paroxysmal atrial fibrillation: Secondary | ICD-10-CM

## 2019-03-09 DIAGNOSIS — I5032 Chronic diastolic (congestive) heart failure: Secondary | ICD-10-CM

## 2019-03-09 DIAGNOSIS — I130 Hypertensive heart and chronic kidney disease with heart failure and stage 1 through stage 4 chronic kidney disease, or unspecified chronic kidney disease: Secondary | ICD-10-CM | POA: Diagnosis not present

## 2019-03-09 DIAGNOSIS — N184 Chronic kidney disease, stage 4 (severe): Secondary | ICD-10-CM

## 2019-03-09 DIAGNOSIS — R001 Bradycardia, unspecified: Secondary | ICD-10-CM

## 2019-03-09 NOTE — Patient Instructions (Addendum)

## 2019-04-28 ENCOUNTER — Other Ambulatory Visit: Payer: Self-pay | Admitting: Orthopedic Surgery

## 2019-04-28 DIAGNOSIS — M19041 Primary osteoarthritis, right hand: Secondary | ICD-10-CM

## 2019-05-04 ENCOUNTER — Other Ambulatory Visit: Payer: Medicare Other

## 2019-05-04 ENCOUNTER — Ambulatory Visit
Admission: RE | Admit: 2019-05-04 | Discharge: 2019-05-04 | Disposition: A | Discharge status: Home / Self Care | Payer: Medicare Other | Source: Ambulatory Visit | Attending: Orthopedic Surgery | Admitting: Orthopedic Surgery

## 2019-05-04 DIAGNOSIS — M19041 Primary osteoarthritis, right hand: Secondary | ICD-10-CM

## 2019-05-30 ENCOUNTER — Telehealth: Payer: Self-pay | Admitting: Cardiology

## 2019-05-30 NOTE — Telephone Encounter (Signed)
Patient states during her recent eye doctor appointment it was recommended that she start taking AREDS 2 preservision which is a multivitamin to help with her vision.   Reviewed with Laurann Montana, NP who advised patient can start taking this medication safely in addition to her other medications. Patient informed and verbalized understanding. No further questions.

## 2019-05-30 NOTE — Telephone Encounter (Signed)
Please call patient regarding a vitamin that the opthomalogist wants her to take.

## 2019-06-13 ENCOUNTER — Ambulatory Visit: Payer: Medicare Other | Admitting: Cardiology

## 2019-07-10 NOTE — Progress Notes (Signed)
Cardiology Office Note:    Date:  07/12/2019   ID:  Sandy Hanson, DOB Oct 19, 1932, MRN [id number]  PCP:  Raina Mina., MD  Cardiologist:  Shirlee More, MD    Referring MD: Raina Mina., MD    ASSESSMENT:    1. Paroxysmal atrial fibrillation (HCC)   2. On amiodarone therapy   3. Chronic diastolic heart failure ([place])   4. Hypertensive heart and chronic kidney disease with heart failure and stage 1 through stage 4 chronic kidney disease, or chronic kidney disease (HCC)   5. Stage 4 chronic kidney disease ([place])   6. Aortic stenosis, moderate   7. Anemia due to stage 4 chronic kidney disease ([place])    PLAN:    In order of problems listed above:  1. She maintained sinus rhythm on low-dose amiodarone continue the same is not anticoagulated because of previous GI bleeding and anemia 2. Continue amiodarone no evidence of liver or thyroid toxicity 3. Well compensated she has severe CKD and I will decrease her MRA to 3 days/week she is also followed by nephrology.  BP at target. 4. Stable aortic stenosis plan repeat echocardiogram after next visit.   Next appointment: 3 months   Medication Adjustments/Labs and Tests Ordered: Current medicines are reviewed at length with the patient today.  Concerns regarding medicines are outlined above.  Orders Placed This Encounter  Procedures  . EKG 12-Lead   Meds ordered this encounter  Medications  . spironolactone (ALDACTONE) 25 MG tablet    Sig: Take 0.5 tablet (12.5 mg) on Monday, Wednesday, and Friday.    Chief Complaint  Patient presents with  . Follow-up  . Aortic Stenosis  . Congestive Heart Failure    History of Present Illness:    Sandy Hanson is a 85 y.o. female with a hx of mild AS diastolic CHF, paroxysmal Atrial Fibrillation on amiodarone  , Dyslipidemia, and stage 4 CKD.  Echocardiogram 11/26/2017 showed moderate aortic stenosis and degenerative mitral valve disease with mild mitral stenosis  ejection  fraction 60 to 65% and findings of diastolic heart failure with pseudonormal diastolic indices. She was last seen 03/09/2019.  She is on spironolactone with severe stage IV CKD. Compliance with diet, lifestyle and medications: Yes  She has had colonoscopy has now on iron for what appears to be iron deficiency anemia with stage IV CKD.  I asked her to discuss with her nephrologist if she does not respond parental iron or erythropoietin.  She is having no edema and her weight is stable no orthopnea chest pain palpitation or syncope and has had no TIA. Past Medical History:  Diagnosis Date  . Abnormal gait 10/17/2015   Last Assessment & Plan:  No recent fall, she has use of her cane and she has a supportive person with her typically to help her   . Anemia 02/20/2015   Overview:  Recurrent, recently required 5 units of PRBC's  . Aortic stenosis, mild 02/20/2015  . Aortic valve insufficiency 02/20/2015   Overview:  Overview:  mild  . Arteriovenous malformation 10/17/2015  . Asthma with COPD ([place]) 10/17/2015  . Asthma, moderate persistent    Amb saturation: Normal 02/14/2014 PFTs>> Severe restriction and obstruction:  FeV1 34% FVC 44%  TLC 40%  Post BD FeV1 29% improvement HRCT Chest >>NORMAL  RA, Allergy panel all normal 02/21/2014 HFA technique 03/28/2014 20% efficiency improved to 75% with coaching,  spacer device added HFA technique 04/04/2014 less than 20% efficiency even with spacer device, the patient  still tends to hold her breath for prolonged periods of time and does not take a deep breath in order to delivered drug into the lung   . Atrial fibrillation ([place]) 10/23/2014  . Cardiorenal syndrome with renal failure 03/07/2016  . Cardiorenal syndrome with renal failure 03/07/2016  . CHF (congestive heart failure) ([place]) 12/12/2014  . Chronic diastolic heart failure ([place]) 02/20/2015  . Chronic midline low back pain without sciatica 11/05/2015   Last Assessment & Plan:  Relevant Hx: Course: Daily Update: Today's  Plan:she is as well following with ortho for her back and has a corset she is wearing for comfort and she does feel it helps her for right now but she is frustrated with her continued inability to not be able to have her balance back yet since she fell. She is not using her walker and is using her friend 's arm and have advised she consider using the walker as it makes her walk and be more responsible with her gait.  Electronically signed by: Mayer Camel, NP 01/02/16 2130  . Chronic respiratory failure ([place]) 08/14/2016  . Colon polyps 10/17/2015  . Community acquired pneumonia of left upper lobe of lung ([place]) 08/05/2016  . Diverticulosis of colon 10/17/2015   Last Assessment & Plan:  She is eating fiber , she is not having bouts of signficant constipation though she takes the miralax about every other day if she needs to  . Dyspnea   . Gout   . Hallux limitus 10/17/2015  . High risk medication use 10/17/2015  . Hypertension   . Hypomagnesemia 10/17/2015  . Major depressive disorder with current active episode 04/15/2016   Last Assessment & Plan:  Will start her on low dose of the zoloft at 25 mg and will see how she does with this and have her repeat her BMP in one mnth to ensure her renal function is stable from this  . Medication reaction 04/13/2014  . Mixed hyperlipidemia 10/17/2015   Last Assessment & Plan:  She will return fasting for her labs to be done to see how she is doing  . Nonrheumatic aortic valve insufficiency 02/20/2015   Overview:  mild  . Obesity 10/17/2015  . Obesity (BMI 30-39.9) 04/09/2016   Last Assessment & Plan:  She was advised to work on her diet and her walking if at all possible , be careful with this, but she can effectively change some of her diet from this  . OSA treated with BiPAP 10/21/2016  . Pleural effusion 10/17/2015  . Postmenopausal 04/15/2016   Last Assessment & Plan:  Obtain dexa for her she requests RH  . Primary insomnia 10/17/2015   Last Assessment  & Plan:  She feels this is stable for her at this time and will follow  . PVC (premature ventricular contraction) 10/17/2015  . Restrictive lung disease 10/21/2016    Past Surgical History:  Procedure Laterality Date  . ABLATION    . CATARACT EXTRACTION    . double knee surgery    . TONSILLECTOMY    . VAGINAL HYSTERECTOMY      Current Medications: Current Meds  Medication Sig  . amiodarone (PACERONE) 200 MG tablet Take 200 mg by mouth daily.  Marland Kitchen atorvastatin (LIPITOR) 20 MG tablet Take 20 mg by mouth daily.  . budesonide (PULMICORT) 0.25 MG/2ML nebulizer solution INHALE 1 VIAL VIA NEBULZIER TWICE DAILY  . Cholecalciferol (VITAMIN D3) 1000 units CAPS Take 1,000 Units by mouth daily.  . febuxostat (ULORIC) 40  MG tablet Take 40 mg by mouth daily.  . Ferrous Sulfate (IRON PO) Take 1 tablet by mouth daily.  . furosemide (LASIX) 40 MG tablet Take 40 mg by mouth 2 (two) times daily.  Marland Kitchen levothyroxine (SYNTHROID, LEVOTHROID) 75 MCG tablet Take 75 mcg by mouth daily.  . montelukast (SINGULAIR) 10 MG tablet TAKE ONE TABLET EVERY DAY  . omeprazole (PRILOSEC) 20 MG capsule Take 20 mg by mouth daily.  . polyethylene glycol (MIRALAX / GLYCOLAX) packet Take 17 g by mouth daily as needed for mild constipation.  . potassium chloride (K-DUR,KLOR-CON) 10 MEQ tablet Take 1 tablet by mouth daily.  . sertraline (ZOLOFT) 50 MG tablet Take 50 mg by mouth daily.   Marland Kitchen spironolactone (ALDACTONE) 25 MG tablet Take 0.5 tablet (12.5 mg) on Monday, Wednesday, and Friday.  . [DISCONTINUED] spironolactone (ALDACTONE) 25 MG tablet Take 12.5 mg by mouth daily.     Allergies:   Amoxicillin-pot clavulanate and Penicillins   Social History   Socioeconomic History  . Marital status: Divorced    Spouse name: Not on file  . Number of children: Not on file  . Years of education: Not on file  . Highest education level: Not on file  Occupational History  . Occupation: Retired    Comment: Research officer, political party Needs  .  Financial resource strain: Not on file  . Food insecurity    Worry: Not on file    Inability: Not on file  . Transportation needs    Medical: Not on file    Non-medical: Not on file  Tobacco Use  . Smoking status: Never Smoker  . Smokeless tobacco: Never Used  Substance and Sexual Activity  . Alcohol use: No  . Drug use: No  . Sexual activity: Not on file  Lifestyle  . Physical activity    Days per week: Not on file    Minutes per session: Not on file  . Stress: Not on file  Relationships  . Social Herbalist on phone: Not on file    Gets together: Not on file    Attends religious service: Not on file    Active member of club or organization: Not on file    Attends meetings of clubs or organizations: Not on file    Relationship status: Not on file  Other Topics Concern  . Not on file  Social History Narrative  . Not on file     Family History: The patient's family history includes Heart disease in her maternal aunt, mother, and paternal aunt; Hypertension in her mother; Lung cancer in her father. ROS:   Please see the history of present illness.    All other systems reviewed and are negative.  EKGs/Labs/Other Studies Reviewed:    The following studies were reviewed today:  EKG:  EKG ordered today and personally reviewed.  The ekg ordered today demonstrates sinus bradycardia 51 bpm first-degree AV block left axis deviation  Recent Labs: 06/28/2019: Creatinine 2.59 stable potassium 3.7 GFR 16 cc TSH 4.76 normal Hemoglobin 9.2 down from her previous hemoglobins of 9.8 10.7 12.0 her indices are normal and iron studies are consistent with iron deficiency.   Physical Exam:    VS:  BP (!) 120/50 (BP Location: Left Arm, Patient Position: Sitting, Cuff Size: Normal)   Pulse (!) 51   Ht 5\' 4"  (1.626 m)   Wt 179 lb 6.4 oz (81.4 kg)   SpO2 99%   BMI 30.79 kg/m  Wt Readings from Last 3 Encounters:  07/12/19 179 lb 6.4 oz (81.4 kg)  03/09/19 184 lb (83.5  kg)  11/29/18 185 lb 3.2 oz (84 kg)     GEN:  Well nourished, well developed in no acute distress HEENT: Normal NECK: No JVD; No carotid bruits LYMPHATICS: No lymphadenopathy CARDIAC: RRR, no murmurs, rubs, gallops RESPIRATORY:  Clear to auscultation without rales, wheezing or rhonchi  ABDOMEN: Soft, non-tender, non-distended MUSCULOSKELETAL:  No edema; No deformity  SKIN: Warm and dry NEUROLOGIC:  Alert and oriented x 3 PSYCHIATRIC:  Normal affect    Signed, Shirlee More, MD  07/12/2019 3:32 PM    [facility]

## 2019-07-12 ENCOUNTER — Other Ambulatory Visit: Payer: Self-pay

## 2019-07-12 ENCOUNTER — Ambulatory Visit (INDEPENDENT_AMBULATORY_CARE_PROVIDER_SITE_OTHER): Payer: Medicare Other | Admitting: Cardiology

## 2019-07-12 VITALS — BP 120/50 | HR 51 | Ht 64.0 in | Wt 179.4 lb

## 2019-07-12 DIAGNOSIS — I130 Hypertensive heart and chronic kidney disease with heart failure and stage 1 through stage 4 chronic kidney disease, or unspecified chronic kidney disease: Secondary | ICD-10-CM

## 2019-07-12 DIAGNOSIS — I480 Paroxysmal atrial fibrillation: Secondary | ICD-10-CM

## 2019-07-12 DIAGNOSIS — I5032 Chronic diastolic (congestive) heart failure: Secondary | ICD-10-CM

## 2019-07-12 DIAGNOSIS — D631 Anemia in chronic kidney disease: Secondary | ICD-10-CM

## 2019-07-12 DIAGNOSIS — N184 Chronic kidney disease, stage 4 (severe): Secondary | ICD-10-CM

## 2019-07-12 DIAGNOSIS — I350 Nonrheumatic aortic (valve) stenosis: Secondary | ICD-10-CM

## 2019-07-12 DIAGNOSIS — Z79899 Other long term (current) drug therapy: Secondary | ICD-10-CM

## 2019-07-12 MED ORDER — SPIRONOLACTONE 25 MG PO TABS: ORAL_TABLET | ORAL | Status: DC

## 2019-07-12 NOTE — Patient Instructions (Addendum)
Medication Instructions:  Your physician has recommended you make the following change in your medication:   DECREASE spironolactone (aldactone) 25 mg: Take 0.5 tablet (12.5 mg) on Monday, Wednesday, Friday.   *If you need a refill on your cardiac medications before your next appointment, please call your pharmacy*  Lab Work: None  If you have labs (blood work) drawn today and your tests are completely normal, you will receive your results only by: Marland Kitchen MyChart Message (if you have MyChart) OR . A paper copy in the mail If you have any lab test that is abnormal or we need to change your treatment, we will call you to review the results.  Testing/Procedures: You had an EKG today.   Follow-Up: At [facility], you and your health needs are our priority.  As part of our continuing mission to provide you with exceptional heart care, we have created designated Provider Care Teams.  These Care Teams include your primary Cardiologist (physician) and Advanced Practice Providers (APPs -  Physician Assistants and Nurse Practitioners) who all work together to provide you with the care you need, when you need it.  Your next appointment:   3 months  The format for your next appointment:   In Person  Provider:   Shirlee More, MD

## 2019-08-16 ENCOUNTER — Telehealth: Payer: Self-pay | Admitting: Cardiology

## 2019-08-16 NOTE — Telephone Encounter (Signed)
Left message for patient to return call.

## 2019-08-16 NOTE — Telephone Encounter (Signed)
Patient has gained a lot of fluid and her feet is swelling really bad and they are very concerned and wants to talk to someone.

## 2019-08-16 NOTE — Telephone Encounter (Addendum)
Called patient. Joy on her dpr reports that the patients weight has increased within the last month almost 10 lbs. This past Friday 08/12/2019 she was 189 lbs and Joy gave her a extra 20 mg of lasix  as Dr. Bettina Gavia has advised her to do if weight increase over 5lbs, she had the extra 20 mg of lasix for 4 days. She is down to 185.4 but still 6 lbs above her normal weight. She does have swelling in feet (but has also been on them more, she is cleaning for holidays), and she does have some shortness of breath on exertion. Things are slowly getting better but they still wanted to inform Dr. Bettina Gavia and see if he still agrees for her to take the extra lasix 20 mg daily on top of her lasix 40 mg twice daily she normally takes. She also takes spironolactone 12.5 mg on Monday, Wednesday, and Friday. Will consult with Dr. Bettina Gavia.

## 2019-08-16 NOTE — Telephone Encounter (Signed)
Take 40 mg BID furosemide until back to usual weight

## 2019-08-17 NOTE — Telephone Encounter (Signed)
Take an extra 40 Mg furosemide daily until back to usual weight in addiction to 40 bid

## 2019-08-18 NOTE — Telephone Encounter (Signed)
Called patient's caregiver, Caryl Asp, and informed her per Dr. Bettina Gavia to have patient take extra 40 mg along with 40 mg bid of lasix until she is back to normal weight. She verbally understood, no further questions.

## 2019-09-20 DIAGNOSIS — I4891 Unspecified atrial fibrillation: Secondary | ICD-10-CM

## 2019-09-20 DIAGNOSIS — N189 Chronic kidney disease, unspecified: Secondary | ICD-10-CM | POA: Diagnosis not present

## 2019-09-20 DIAGNOSIS — J45909 Unspecified asthma, uncomplicated: Secondary | ICD-10-CM | POA: Diagnosis not present

## 2019-09-20 DIAGNOSIS — K922 Gastrointestinal hemorrhage, unspecified: Secondary | ICD-10-CM

## 2019-09-20 DIAGNOSIS — I509 Heart failure, unspecified: Secondary | ICD-10-CM

## 2019-09-20 DIAGNOSIS — D649 Anemia, unspecified: Secondary | ICD-10-CM | POA: Diagnosis not present

## 2019-09-21 DIAGNOSIS — K922 Gastrointestinal hemorrhage, unspecified: Secondary | ICD-10-CM | POA: Diagnosis not present

## 2019-09-21 DIAGNOSIS — D649 Anemia, unspecified: Secondary | ICD-10-CM | POA: Diagnosis not present

## 2019-09-21 DIAGNOSIS — N189 Chronic kidney disease, unspecified: Secondary | ICD-10-CM | POA: Diagnosis not present

## 2019-09-21 DIAGNOSIS — J45909 Unspecified asthma, uncomplicated: Secondary | ICD-10-CM | POA: Diagnosis not present

## 2019-09-22 DIAGNOSIS — J45909 Unspecified asthma, uncomplicated: Secondary | ICD-10-CM | POA: Diagnosis not present

## 2019-09-22 DIAGNOSIS — D649 Anemia, unspecified: Secondary | ICD-10-CM | POA: Diagnosis not present

## 2019-09-22 DIAGNOSIS — N189 Chronic kidney disease, unspecified: Secondary | ICD-10-CM | POA: Diagnosis not present

## 2019-09-22 DIAGNOSIS — K922 Gastrointestinal hemorrhage, unspecified: Secondary | ICD-10-CM | POA: Diagnosis not present

## 2019-09-23 DIAGNOSIS — N189 Chronic kidney disease, unspecified: Secondary | ICD-10-CM | POA: Diagnosis not present

## 2019-09-23 DIAGNOSIS — J45909 Unspecified asthma, uncomplicated: Secondary | ICD-10-CM | POA: Diagnosis not present

## 2019-09-23 DIAGNOSIS — D649 Anemia, unspecified: Secondary | ICD-10-CM | POA: Diagnosis not present

## 2019-09-23 DIAGNOSIS — K922 Gastrointestinal hemorrhage, unspecified: Secondary | ICD-10-CM | POA: Diagnosis not present

## 2019-10-16 NOTE — Progress Notes (Signed)
Cardiology Office Note:    Date:  10/18/2019   ID:  Sandy Hanson, DOB 10/27/1932, MRN [id number]  PCP:  Raina Mina., MD  Cardiologist:  Shirlee More, MD    Referring MD: Raina Mina., MD    ASSESSMENT:    1. Paroxysmal atrial fibrillation (HCC)   2. On amiodarone therapy   3. Chronic diastolic heart failure ([place])   4. Hypertensive heart and chronic kidney disease with heart failure and stage 1 through stage 4 chronic kidney disease, or chronic kidney disease ([place])   5. Aortic stenosis, moderate   6. Stage 4 chronic kidney disease ([place])   7. Anemia due to stage 4 chronic kidney disease ([place])   8. Mixed hyperlipidemia    PLAN:    In order of problems listed above:  1. Continue amiodarone low-dose and is not anticoagulated with GI bleeding 2. Heart failure is nicely compensated continue current reduced dose diuretic and is improved after transfusion and correction of anemia 3. BP at target continue current treatment of current loop diuretic 4. Stable improved CKD 5. Stable improved anemia 6. She will continue her statin lipids at target 7. Her aortic stenosis is a concern recheck echocardiogram if severe would need to consider the merits of TAVR   Next appointment: 3 months after echocardiogram   Medication Adjustments/Labs and Tests Ordered: Current medicines are reviewed at length with the patient today.  Concerns regarding medicines are outlined above.  No orders of the defined types were placed in this encounter.  No orders of the defined types were placed in this encounter.   Chief Complaint  Patient presents with  . Follow-up  . Atrial Fibrillation  . Aortic Stenosis  . Congestive Heart Failure    History of Present Illness:    Sandy Hanson is a 86 y.o. female with a hx of paroxysmal atrial fibrillation on amiodarone not anticoagulated because of previous major hemorrhagic complication, moderate aortic stenosis degenerative mitral valve  disease with mild mitral stenosis ejection fraction 60 to [id number] diastolic heart failure dyslipidemia and stage IV CKD.  She has also followed by nephrology and has anemia with CKD.  She was last seen 07/12/2019. Her last echocardiogram was 11/26/2017.  Compliance with diet, lifestyle and medications: Yes  Record review in care everywhere shows nephrology visit 10/12/2019.  Most recent BUN and creatinine 09/23/2019 26/1.6 and hemoglobin improved to 10.6.  It was felt at that time that her hypertension and CKD were stable and she was improved after transfusion.  Laboratory studies performed at [facility] 09/20/2019 showed creatinine improved 1.89 from previous 2.59 potassium 3.9 normal liver function test cholesterol 93 LDL 45 HDL 38 and TSH elevated 11.77.  Her hemoglobin at that time 6.7 prior to transfusion.  She was admitted to the hospital for 3 days had endoscopy and cauterization of gastric bleeding point.  Since then her weight is down no edema shortness of breath chest pain palpitation or syncope and feels markedly improved. Past Medical History:  Diagnosis Date  . Abnormal gait 10/17/2015   Last Assessment & Plan:  No recent fall, she has use of her cane and she has a supportive person with her typically to help her   . Anemia 02/20/2015   Overview:  Recurrent, recently required 5 units of PRBC's  . Aortic stenosis, mild 02/20/2015  . Aortic valve insufficiency 02/20/2015   Overview:  Overview:  mild  . Arteriovenous malformation 10/17/2015  . Asthma with COPD ([place]) 10/17/2015  . Asthma,  moderate persistent    Amb saturation: Normal 02/14/2014 PFTs>> Severe restriction and obstruction:  FeV1 34% FVC 44%  TLC 40%  Post BD FeV1 29% improvement HRCT Chest >>NORMAL  RA, Allergy panel all normal 02/21/2014 HFA technique 03/28/2014 20% efficiency improved to 75% with coaching,  spacer device added HFA technique 04/04/2014 less than 20% efficiency even with spacer device, the patient still tends to  hold her breath for prolonged periods of time and does not take a deep breath in order to delivered drug into the lung   . Atrial fibrillation ([place]) 10/23/2014  . Cardiorenal syndrome with renal failure 03/07/2016  . Cardiorenal syndrome with renal failure 03/07/2016  . CHF (congestive heart failure) ([place]) 12/12/2014  . Chronic diastolic heart failure ([place]) 02/20/2015  . Chronic midline low back pain without sciatica 11/05/2015   Last Assessment & Plan:  Relevant Hx: Course: Daily Update: Today's Plan:she is as well following with ortho for her back and has a corset she is wearing for comfort and she does feel it helps her for right now but she is frustrated with her continued inability to not be able to have her balance back yet since she fell. She is not using her walker and is using her friend 's arm and have advised she consider using the walker as it makes her walk and be more responsible with her gait.  Electronically signed by: Mayer Camel, NP 01/02/16 2130  . Chronic respiratory failure ([place]) 08/14/2016  . Colon polyps 10/17/2015  . Community acquired pneumonia of left upper lobe of lung 08/05/2016  . Diverticulosis of colon 10/17/2015   Last Assessment & Plan:  She is eating fiber , she is not having bouts of signficant constipation though she takes the miralax about every other day if she needs to  . Dyspnea   . Gout   . Hallux limitus 10/17/2015  . High risk medication use 10/17/2015  . Hypertension   . Hypomagnesemia 10/17/2015  . Major depressive disorder with current active episode 04/15/2016   Last Assessment & Plan:  Will start her on low dose of the zoloft at 25 mg and will see how she does with this and have her repeat her BMP in one mnth to ensure her renal function is stable from this  . Medication reaction 04/13/2014  . Mixed hyperlipidemia 10/17/2015   Last Assessment & Plan:  She will return fasting for her labs to be done to see how she is doing  . Nonrheumatic aortic  valve insufficiency 02/20/2015   Overview:  mild  . Obesity 10/17/2015  . Obesity (BMI 30-39.9) 04/09/2016   Last Assessment & Plan:  She was advised to work on her diet and her walking if at all possible , be careful with this, but she can effectively change some of her diet from this  . OSA treated with BiPAP 10/21/2016  . Pleural effusion 10/17/2015  . Postmenopausal 04/15/2016   Last Assessment & Plan:  Obtain dexa for her she requests RH  . Primary insomnia 10/17/2015   Last Assessment & Plan:  She feels this is stable for her at this time and will follow  . PVC (premature ventricular contraction) 10/17/2015  . Restrictive lung disease 10/21/2016    Past Surgical History:  Procedure Laterality Date  . ABLATION    . CATARACT EXTRACTION    . double knee surgery    . TONSILLECTOMY    . VAGINAL HYSTERECTOMY      Current Medications: Current  Meds  Medication Sig  . albuterol (VENTOLIN HFA) 108 (90 Base) MCG/ACT inhaler Inhale 2 puffs into the lungs every 6 (six) hours as needed.  Marland Kitchen amiodarone (PACERONE) 200 MG tablet Take 200 mg by mouth daily.  Marland Kitchen ascorbic acid (VITAMIN C) 500 MG tablet Take by mouth.  Marland Kitchen atorvastatin (LIPITOR) 20 MG tablet Take 20 mg by mouth daily.  . budesonide (PULMICORT) 0.25 MG/2ML nebulizer solution INHALE 1 VIAL VIA NEBULZIER TWICE DAILY  . Cholecalciferol (VITAMIN D3) 1000 units CAPS Take 1,000 Units by mouth daily.  . febuxostat (ULORIC) 40 MG tablet Take 40 mg by mouth daily.  Marland Kitchen FERREX 150 150 MG capsule Take 150 mg by mouth daily.  . furosemide (LASIX) 40 MG tablet Take 40 mg by mouth 2 (two) times daily.  Marland Kitchen levothyroxine (SYNTHROID) 88 MCG tablet Take 88 mcg by mouth daily.  . montelukast (SINGULAIR) 10 MG tablet TAKE ONE TABLET EVERY DAY  . pantoprazole (PROTONIX) 40 MG tablet SMARTSIG:1 Tablet(s) By Mouth Morning-Night  . polyethylene glycol (MIRALAX / GLYCOLAX) packet Take 17 g by mouth daily as needed for mild constipation.  . potassium chloride  (K-DUR,KLOR-CON) 10 MEQ tablet Take 1 tablet by mouth daily.  . sertraline (ZOLOFT) 50 MG tablet Take 50 mg by mouth daily.      Allergies:   Amoxicillin-pot clavulanate and Penicillins   Social History   Socioeconomic History  . Marital status: Divorced    Spouse name: Not on file  . Number of children: Not on file  . Years of education: Not on file  . Highest education level: Not on file  Occupational History  . Occupation: Retired    Comment: Pharmacist, hospital  Tobacco Use  . Smoking status: Never Smoker  . Smokeless tobacco: Never Used  Substance and Sexual Activity  . Alcohol use: No  . Drug use: No  . Sexual activity: Not Currently  Other Topics Concern  . Not on file  Social History Narrative  . Not on file   Social Determinants of Health   Financial Resource Strain:   . Difficulty of Paying Living Expenses: Not on file  Food Insecurity:   . Worried About Charity fundraiser in the Last Year: Not on file  . Ran Out of Food in the Last Year: Not on file  Transportation Needs:   . Lack of Transportation (Medical): Not on file  . Lack of Transportation (Non-Medical): Not on file  Physical Activity:   . Days of Exercise per Week: Not on file  . Minutes of Exercise per Session: Not on file  Stress:   . Feeling of Stress : Not on file  Social Connections:   . Frequency of Communication with Friends and Family: Not on file  . Frequency of Social Gatherings with Friends and Family: Not on file  . Attends Religious Services: Not on file  . Active Member of Clubs or Organizations: Not on file  . Attends Archivist Meetings: Not on file  . Marital Status: Not on file     Family History: The patient's family history includes Heart disease in her maternal aunt, mother, and paternal aunt; Hypertension in her mother; Lung cancer in her father. ROS:   Please see the history of present illness.    All other systems reviewed and are negative.  EKGs/Labs/Other Studies  Reviewed:    The following studies were reviewed today:  EKG:  EKG ordered today and personally reviewed.  The ekg ordered today demonstrates shows her  to be in sinus rhythm first-degree AV block incomplete right bundle branch block nonspecific minor ST and T changes left axis deviation left anterior hemiblock  Recent Labs: No results found for requested labs within last 8760 hours.  Recent Lipid Panel No results found for: CHOL, TRIG, HDL, CHOLHDL, VLDL, LDLCALC, LDLDIRECT  Physical Exam:    VS:  BP 130/62   Pulse (!) 48   Temp (!) 97.1 F (36.2 C)   Ht 5\' 4"  (1.626 m)   Wt 175 lb (79.4 kg)   LMP  (LMP Unknown)   SpO2 96%   BMI 30.04 kg/m     Wt Readings from Last 3 Encounters:  10/18/19 175 lb (79.4 kg)  07/12/19 179 lb 6.4 oz (81.4 kg)  03/09/19 184 lb (83.5 kg)     GEN:  Well nourished, well developed in no acute distress HEENT: Normal NECK: No JVD; No carotid bruits LYMPHATICS: No lymphadenopathy CARDIAC: 3/6 aortic outflow murmur does not encompass S2 radiates to the clavicular area RRR, no rubs, gallops RESPIRATORY:  Clear to auscultation without rales, wheezing or rhonchi  ABDOMEN: Soft, non-tender, non-distended MUSCULOSKELETAL:  No edema; No deformity  SKIN: Warm and dry NEUROLOGIC:  Alert and oriented x 3 PSYCHIATRIC:  Normal affect    Signed, Shirlee More, MD  10/18/2019 2:59 PM    [place]

## 2019-10-18 ENCOUNTER — Encounter: Payer: Self-pay | Admitting: Cardiology

## 2019-10-18 ENCOUNTER — Other Ambulatory Visit: Payer: Self-pay

## 2019-10-18 ENCOUNTER — Ambulatory Visit (INDEPENDENT_AMBULATORY_CARE_PROVIDER_SITE_OTHER): Payer: Medicare PPO | Admitting: Cardiology

## 2019-10-18 VITALS — BP 130/62 | HR 48 | Temp 97.1°F | Ht 64.0 in | Wt 175.0 lb

## 2019-10-18 DIAGNOSIS — I130 Hypertensive heart and chronic kidney disease with heart failure and stage 1 through stage 4 chronic kidney disease, or unspecified chronic kidney disease: Secondary | ICD-10-CM | POA: Diagnosis not present

## 2019-10-18 DIAGNOSIS — I5032 Chronic diastolic (congestive) heart failure: Secondary | ICD-10-CM

## 2019-10-18 DIAGNOSIS — Z79899 Other long term (current) drug therapy: Secondary | ICD-10-CM | POA: Diagnosis not present

## 2019-10-18 DIAGNOSIS — N184 Chronic kidney disease, stage 4 (severe): Secondary | ICD-10-CM

## 2019-10-18 DIAGNOSIS — E782 Mixed hyperlipidemia: Secondary | ICD-10-CM

## 2019-10-18 DIAGNOSIS — I350 Nonrheumatic aortic (valve) stenosis: Secondary | ICD-10-CM

## 2019-10-18 DIAGNOSIS — I480 Paroxysmal atrial fibrillation: Secondary | ICD-10-CM | POA: Diagnosis not present

## 2019-10-18 DIAGNOSIS — D631 Anemia in chronic kidney disease: Secondary | ICD-10-CM

## 2019-10-18 NOTE — Patient Instructions (Signed)
Medication Instructions:  Your physician recommends that you continue on your current medications as directed. Please refer to the Current Medication list given to you today.  *If you need a refill on your cardiac medications before your next appointment, please call your pharmacy*  Lab Work: None ordered  If you have labs (blood work) drawn today and your tests are completely normal, you will receive your results only by: Marland Kitchen MyChart Message (if you have MyChart) OR . A paper copy in the mail If you have any lab test that is abnormal or we need to change your treatment, we will call you to review the results.  Testing/Procedures: Your physician has requested that you have an echocardiogram. Echocardiography is a painless test that uses sound waves to create images of your heart. It provides your doctor with information about the size and shape of your heart and how well your heart's chambers and valves are working. This procedure takes approximately one hour. There are no restrictions for this procedure.    Follow-Up: At [facility], you and your health needs are our priority.  As part of our continuing mission to provide you with exceptional heart care, we have created designated Provider Care Teams.  These Care Teams include your primary Cardiologist (physician) and Advanced Practice Providers (APPs -  Physician Assistants and Nurse Practitioners) who all work together to provide you with the care you need, when you need it.  Your next appointment:   3 month(s)  The format for your next appointment:   In Person  Provider:   Shirlee More, MD  Other Instructions  Echocardiogram An echocardiogram is a procedure that uses painless sound waves (ultrasound) to produce an image of the heart. Images from an echocardiogram can provide important information about:  Signs of coronary artery disease (CAD).  Aneurysm detection. An aneurysm is a weak or damaged part of an artery wall that  bulges out from the normal force of blood pumping through the body.  Heart size and shape. Changes in the size or shape of the heart can be associated with certain conditions, including heart failure, aneurysm, and CAD.  Heart muscle function.  Heart valve function.  Signs of a past heart attack.  Fluid buildup around the heart.  Thickening of the heart muscle.  A tumor or infectious growth around the heart valves. Tell a health care provider about:  Any allergies you have.  All medicines you are taking, including vitamins, herbs, eye drops, creams, and over-the-counter medicines.  Any blood disorders you have.  Any surgeries you have had.  Any medical conditions you have.  Whether you are pregnant or may be pregnant. What are the risks? Generally, this is a safe procedure. However, problems may occur, including:  Allergic reaction to dye (contrast) that may be used during the procedure. What happens before the procedure? No specific preparation is needed. You may eat and drink normally. What happens during the procedure?   An IV tube may be inserted into one of your veins.  You may receive contrast through this tube. A contrast is an injection that improves the quality of the pictures from your heart.  A gel will be applied to your chest.  A wand-like tool (transducer) will be moved over your chest. The gel will help to transmit the sound waves from the transducer.  The sound waves will harmlessly bounce off of your heart to allow the heart images to be captured in real-time motion. The images will be recorded  on a computer. The procedure may vary among health care providers and hospitals. What happens after the procedure?  You may return to your normal, everyday life, including diet, activities, and medicines, unless your health care provider tells you not to do that. Summary  An echocardiogram is a procedure that uses painless sound waves (ultrasound) to produce  an image of the heart.  Images from an echocardiogram can provide important information about the size and shape of your heart, heart muscle function, heart valve function, and fluid buildup around your heart.  You do not need to do anything to prepare before this procedure. You may eat and drink normally.  After the echocardiogram is completed, you may return to your normal, everyday life, unless your health care provider tells you not to do that. This information is not intended to replace advice given to you by your health care provider. Make sure you discuss any questions you have with your health care provider. Document Revised: 12/09/2018 Document Reviewed: 09/20/2016 Elsevier Patient Education  [place].

## 2019-11-15 IMAGING — MG DIGITAL SCREENING BILATERAL MAMMOGRAM WITH TOMO AND CAD
8 series · 8 of 24 positions shown · non-contrast
Comparison: Previous exam(s).

CLINICAL DATA: Screening.

EXAM:
DIGITAL SCREENING BILATERAL MAMMOGRAM WITH TOMO AND CAD

[L MLO synth-2D]
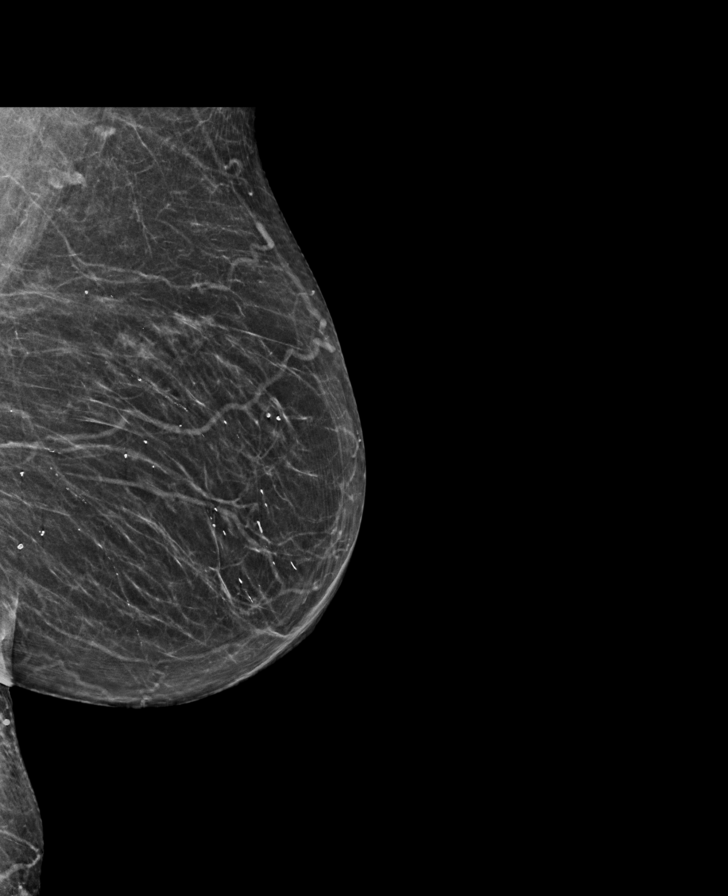

[R CC synth-2D]
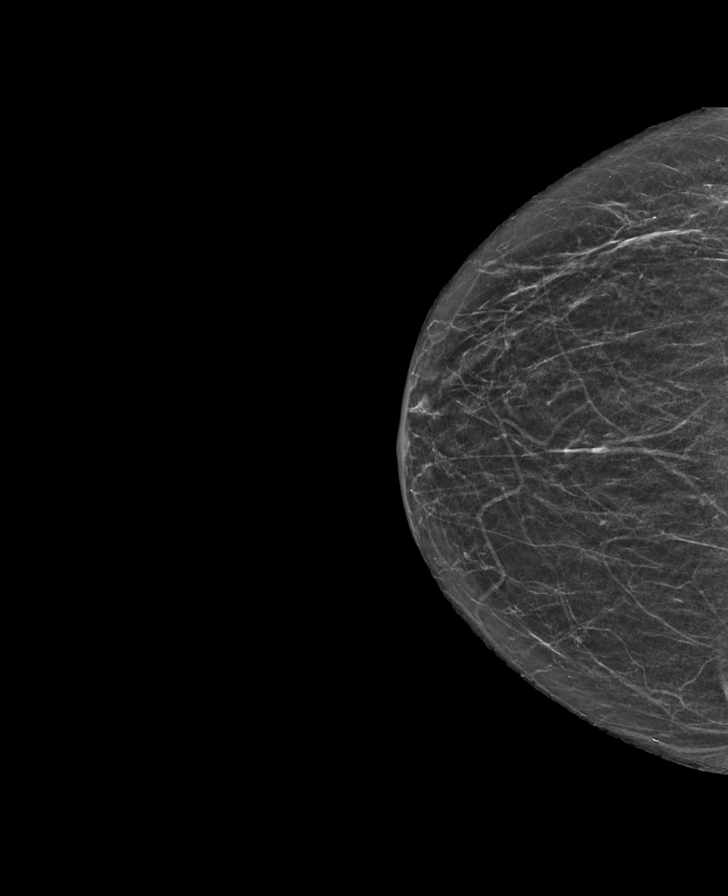

[L CC synth-2D]
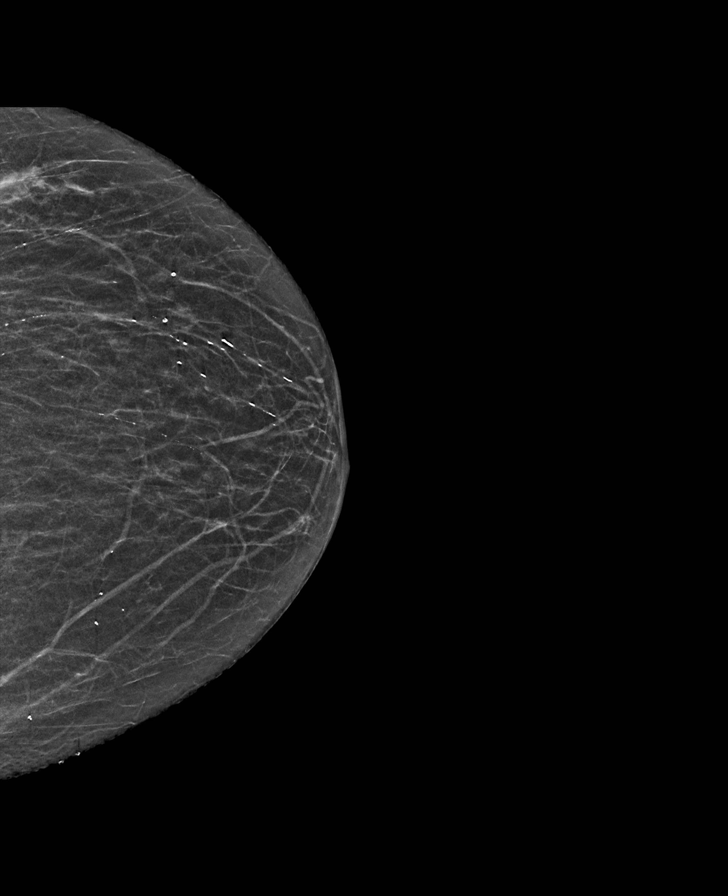

[R MLO synth-2D]
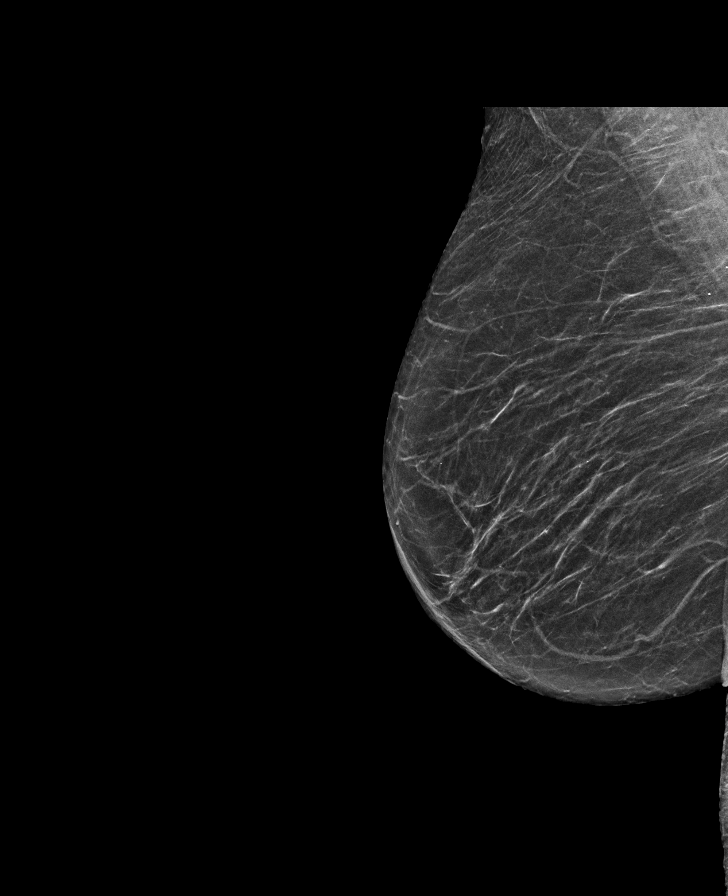

[R MLO tomo · tomo slice 26/51.0]
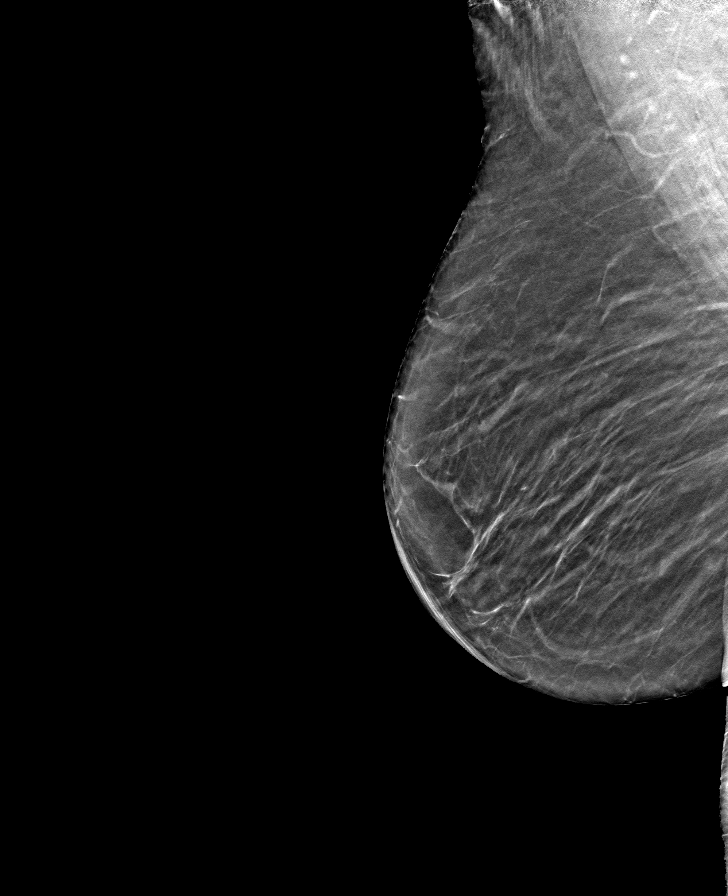

[L CC tomo · tomo slice 21/41.0]
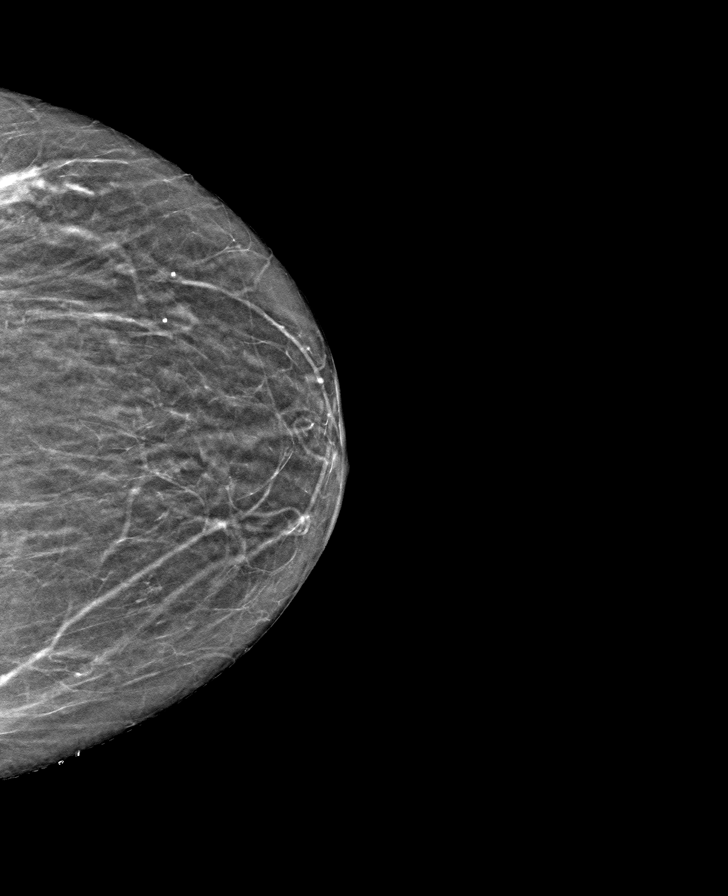

[R CC tomo · tomo slice 23/44.0]
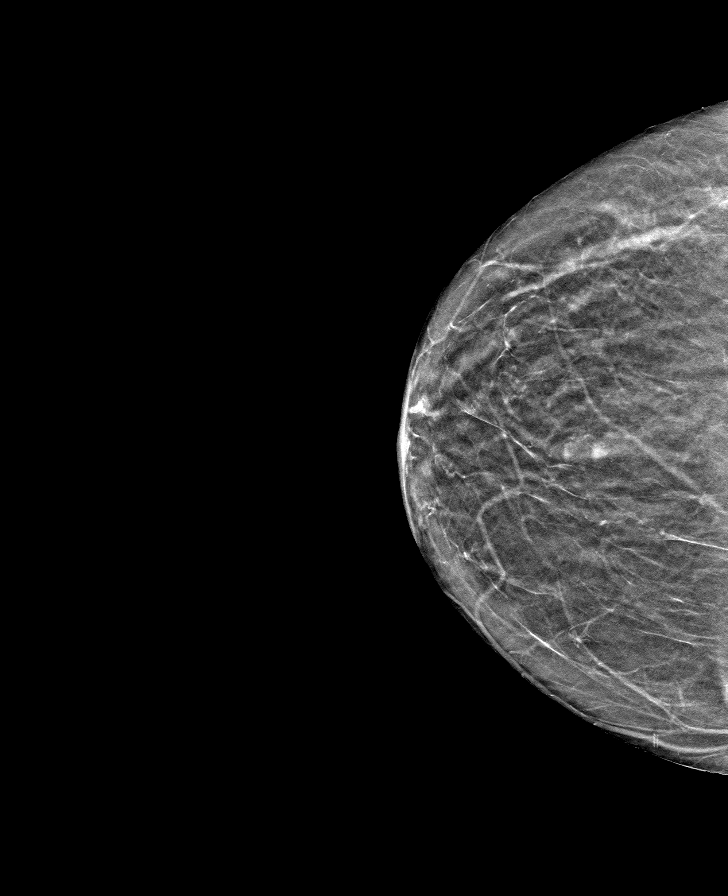

[L MLO tomo · tomo slice 28/55.0]
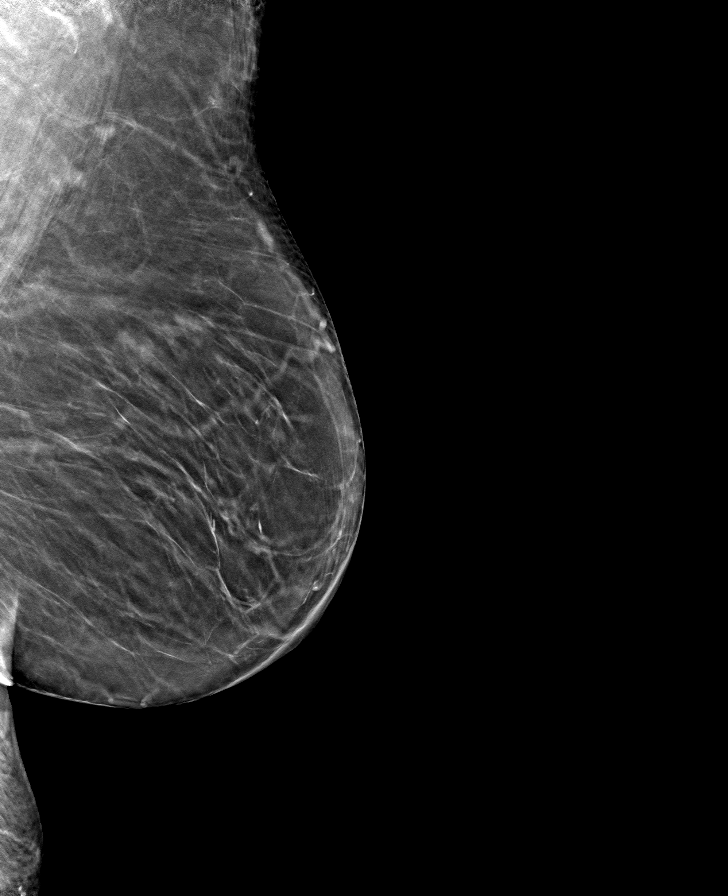

[8 of 24 positions shown; findings below may reference images not displayed]

ACR Breast Density Category c: The breast tissue is heterogeneously
dense, which may obscure small masses.
FINDINGS: In the left breast, possible distortion warrants further evaluation.
In the right breast, no findings suspicious for malignancy. Images
were processed with CAD.
IMPRESSION: Further evaluation is suggested for possible distortion in the left
breast.

RECOMMENDATION:
Diagnostic mammogram and possibly ultrasound of the left breast.
(Code:J9-G-HHO)

The patient will be contacted regarding the findings, and additional
imaging will be scheduled.

BI-RADS CATEGORY  0: Incomplete. Need additional imaging evaluation
and/or prior mammograms for comparison.

## 2019-12-05 ENCOUNTER — Other Ambulatory Visit: Payer: Self-pay

## 2019-12-05 ENCOUNTER — Ambulatory Visit (INDEPENDENT_AMBULATORY_CARE_PROVIDER_SITE_OTHER): Payer: Medicare PPO

## 2019-12-05 DIAGNOSIS — I350 Nonrheumatic aortic (valve) stenosis: Secondary | ICD-10-CM

## 2019-12-05 NOTE — Progress Notes (Signed)
Complete echocardiogram performed.  Jimmy Alonzo Owczarzak RDCS, RVT  

## 2019-12-06 ENCOUNTER — Telehealth: Payer: Self-pay

## 2019-12-06 NOTE — Telephone Encounter (Signed)
-----   Message from Richardo Priest, MD sent at 12/06/2019  7:47 AM EDT ----- Normal or stable result  Aortic valve blockage or stenosis is not yet severe will need to recheck in 6 to 12 months.

## 2019-12-06 NOTE — Telephone Encounter (Signed)
The patient's caregiver has been notified of the Echo result and verbalized understanding.  All questions (if any) were answered. Frederik Schmidt, RN 12/06/2019 8:37 AM

## 2020-01-16 ENCOUNTER — Ambulatory Visit: Payer: Medicare PPO | Admitting: Cardiology

## 2020-01-23 ENCOUNTER — Other Ambulatory Visit: Payer: Self-pay | Admitting: Cardiology

## 2020-02-21 NOTE — Progress Notes (Signed)
Cardiology Office Note:    Date:  02/22/2020   ID:  Laurette Villescas, DOB 07-08-1933, MRN [id number]  PCP:  Raina Mina., MD  Cardiologist:  Shirlee More, MD    Referring MD: Raina Mina., MD    ASSESSMENT:    1. Aortic stenosis, moderate   2. Chronic diastolic heart failure (HCC)   3. Paroxysmal atrial fibrillation ([place])   4. On amiodarone therapy   5. Hypertensive heart and chronic kidney disease with heart failure and stage 1 through stage 4 chronic kidney disease, or chronic kidney disease ([place])   6. Anemia due to stage 4 chronic kidney disease ([place])   7. Mixed hyperlipidemia    PLAN:    In order of problems listed above:  1. Aortic stenosis is moderate not quite severe and likely the gradients were magnified by her anemia.  We will recheck echocardiogram in 6 months note she had normal stroke-volume index.  I will send a copy of note to GI as these individuals can have an acquired coagulopathy in about a third of them have mucosal bleeding. 2. Heart failure is compensated continue her current diuretic 3. Stable maintaining sinus rhythm on amiodarone no evidence of toxicity 4. BP at target stable CKD managed by nephrology was also giving erythropoietin for anemia I encouraged the patient to inquire whether sugar needs iron 5. Continue her statin   Next appointment: 6 months   Medication Adjustments/Labs and Tests Ordered: Current medicines are reviewed at length with the patient today.  Concerns regarding medicines are outlined above.  Orders Placed This Encounter  Procedures  . ECHOCARDIOGRAM COMPLETE   No orders of the defined types were placed in this encounter.   No chief complaint on file.   History of Present Illness:    Courtni Balash is a 86 y.o. female with a hx of  paroxysmal atrial fibrillation on amiodarone not anticoagulated because of previous major hemorrhagic complication, moderate aortic stenosis degenerative mitral valve disease with  mild mitral stenosis ejection fraction 60 to 56% diastolic heart failure dyslipidemia and stage IV CKD.  She has also followed by nephrology and has anemia with CKD last seen 10/18/2019. Laboratory studies performed at [facility] 09/20/2019 showed creatinine improved 1.89 from previous 2.59 potassium 3.9 normal liver function test cholesterol 93 LDL 45 HDL 38 and TSH elevated 11.77.  Her hemoglobin at that time 6.7 prior to transfusion.  She was admitted to the hospital for 3 days had endoscopy and cauterization of gastric bleeding point   Compliance with diet, lifestyle and medications: Yes  I was unaware that anemia is a problem again she has received Procrit her last hemoglobin is 8.3 and yesterday had upper endoscopy with cauterization of bleeding point questionable angiodysplasia.  Despite this she is not short of breath has no edema chest pain palpitation or syncope.  12/05/2019: 1. Left ventricular ejection fraction, by estimation, is 55 to 60%. The  left ventricle has normal function. The left ventricle has no regional  wall motion abnormalities. There is moderate concentric left ventricular  hypertrophy. Left ventricular  diastolic parameters are consistent with Grade II diastolic dysfunction  (pseudonormalization). Elevated left atrial pressure.  2. Right ventricular systolic function is normal. The right ventricular  size is normal.  3. Left atrial size was severely dilated.  4. The mitral valve is degenerative. Moderate mitral valve regurgitation.  No evidence of mitral stenosis.  5. The aortic valve is tricuspid. Aortic valve regurgitation is mild.  Moderate to severe  aortic valve stenosis. Aortic valve mean gradient measures 34.0 mmHg. Aortic  valve peak gradient measures 58.1 mmHg. Aortic valve area, by VTI measures 0.80 cm and VTI ratio 0.25. SVI is normal 48 cc/m2.  Previous peak and mean gradients 45/25 mmHg March 2019 6. The inferior vena cava is normal in size  with greater than 50%  respiratory variability, suggesting right atrial pressure of 3 mmHg.   02/08/2020 hemoglobin 8.7 CMP GFR 22 cc normal liver function test 09/20/2019 TSH 11.761 levothyroxine Past Medical History:  Diagnosis Date  . Abnormal gait 10/17/2015   Last Assessment & Plan:  No recent fall, she has use of her cane and she has a supportive person with her typically to help her   . Anemia 02/20/2015   Overview:  Recurrent, recently required 5 units of PRBC's  . Aortic stenosis, mild 02/20/2015  . Aortic valve insufficiency 02/20/2015   Overview:  Overview:  mild  . Arteriovenous malformation 10/17/2015  . Asthma with COPD ([place]) 10/17/2015  . Asthma, moderate persistent    Amb saturation: Normal 02/14/2014 PFTs>> Severe restriction and obstruction:  FeV1 34% FVC 44%  TLC 40%  Post BD FeV1 29% improvement HRCT Chest >>NORMAL  RA, Allergy panel all normal 02/21/2014 HFA technique 03/28/2014 20% efficiency improved to 75% with coaching,  spacer device added HFA technique 04/04/2014 less than 20% efficiency even with spacer device, the patient still tends to hold her breath for prolonged periods of time and does not take a deep breath in order to delivered drug into the lung   . Atrial fibrillation ([place]) 10/23/2014  . Cardiorenal syndrome with renal failure 03/07/2016  . Cardiorenal syndrome with renal failure 03/07/2016  . CHF (congestive heart failure) ([place]) 12/12/2014  . Chronic diastolic heart failure ([place]) 02/20/2015  . Chronic midline low back pain without sciatica 11/05/2015   Last Assessment & Plan:  Relevant Hx: Course: Daily Update: Today's Plan:she is as well following with ortho for her back and has a corset she is wearing for comfort and she does feel it helps her for right now but she is frustrated with her continued inability to not be able to have her balance back yet since she fell. She is not using her walker and is using her friend 's arm and have advised she consider using the walker as  it makes her walk and be more responsible with her gait.  Electronically signed by: Mayer Camel, NP 01/02/16 2130  . Chronic respiratory failure ([place]) 08/14/2016  . Colon polyps 10/17/2015  . Community acquired pneumonia of left upper lobe of lung 08/05/2016  . Diverticulosis of colon 10/17/2015   Last Assessment & Plan:  She is eating fiber , she is not having bouts of signficant constipation though she takes the miralax about every other day if she needs to  . Dyspnea   . Gout   . Hallux limitus 10/17/2015  . High risk medication use 10/17/2015  . Hypertension   . Hypomagnesemia 10/17/2015  . Major depressive disorder with current active episode 04/15/2016   Last Assessment & Plan:  Will start her on low dose of the zoloft at 25 mg and will see how she does with this and have her repeat her BMP in one mnth to ensure her renal function is stable from this  . Medication reaction 04/13/2014  . Mixed hyperlipidemia 10/17/2015   Last Assessment & Plan:  She will return fasting for her labs to be done to see how she is  doing  . Nonrheumatic aortic valve insufficiency 02/20/2015   Overview:  mild  . Obesity 10/17/2015  . Obesity (BMI 30-39.9) 04/09/2016   Last Assessment & Plan:  She was advised to work on her diet and her walking if at all possible , be careful with this, but she can effectively change some of her diet from this  . OSA treated with BiPAP 10/21/2016  . Pleural effusion 10/17/2015  . Postmenopausal 04/15/2016   Last Assessment & Plan:  Obtain dexa for her she requests RH  . Primary insomnia 10/17/2015   Last Assessment & Plan:  She feels this is stable for her at this time and will follow  . PVC (premature ventricular contraction) 10/17/2015  . Restrictive lung disease 10/21/2016    Past Surgical History:  Procedure Laterality Date  . ABLATION    . CATARACT EXTRACTION    . double knee surgery    . TONSILLECTOMY    . VAGINAL HYSTERECTOMY      Current  Medications: Current Meds  Medication Sig  . albuterol (VENTOLIN HFA) 108 (90 Base) MCG/ACT inhaler Inhale 2 puffs into the lungs every 6 (six) hours as needed.  Marland Kitchen amiodarone (PACERONE) 200 MG tablet Take 200 mg by mouth daily.  Marland Kitchen ascorbic acid (VITAMIN C) 500 MG tablet Take by mouth.  Marland Kitchen atorvastatin (LIPITOR) 20 MG tablet Take 20 mg by mouth daily.  . budesonide (PULMICORT) 0.25 MG/2ML nebulizer solution INHALE 1 VIAL VIA NEBULZIER TWICE DAILY  . Cholecalciferol (VITAMIN D3) 1000 units CAPS Take 1,000 Units by mouth daily.  . febuxostat (ULORIC) 40 MG tablet Take 40 mg by mouth daily.  Marland Kitchen FERREX 150 150 MG capsule Take 150 mg by mouth daily.  . furosemide (LASIX) 40 MG tablet Take 40 mg by mouth 2 (two) times daily.  Marland Kitchen levothyroxine (SYNTHROID) 88 MCG tablet Take 88 mcg by mouth daily.  . montelukast (SINGULAIR) 10 MG tablet TAKE ONE TABLET EVERY DAY  . pantoprazole (PROTONIX) 40 MG tablet SMARTSIG:1 Tablet(s) By Mouth Morning-Night  . polyethylene glycol (MIRALAX / GLYCOLAX) packet Take 17 g by mouth daily as needed for mild constipation.  . potassium chloride (K-DUR,KLOR-CON) 10 MEQ tablet Take 1 tablet by mouth daily.  . sertraline (ZOLOFT) 50 MG tablet Take 50 mg by mouth daily.      Allergies:   Amoxicillin-pot clavulanate and Penicillins   Social History   Socioeconomic History  . Marital status: Divorced    Spouse name: Not on file  . Number of children: Not on file  . Years of education: Not on file  . Highest education level: Not on file  Occupational History  . Occupation: Retired    Comment: Pharmacist, hospital  Tobacco Use  . Smoking status: Never Smoker  . Smokeless tobacco: Never Used  Vaping Use  . Vaping Use: Never used  Substance and Sexual Activity  . Alcohol use: No  . Drug use: No  . Sexual activity: Not Currently  Other Topics Concern  . Not on file  Social History Narrative  . Not on file   Social Determinants of Health   Financial Resource Strain:   .  Difficulty of Paying Living Expenses:   Food Insecurity:   . Worried About Charity fundraiser in the Last Year:   . Arboriculturist in the Last Year:   Transportation Needs:   . Film/video editor (Medical):   Marland Kitchen Lack of Transportation (Non-Medical):   Physical Activity:   . Days of  Exercise per Week:   . Minutes of Exercise per Session:   Stress:   . Feeling of Stress :   Social Connections:   . Frequency of Communication with Friends and Family:   . Frequency of Social Gatherings with Friends and Family:   . Attends Religious Services:   . Active Member of Clubs or Organizations:   . Attends Archivist Meetings:   Marland Kitchen Marital Status:      Family History: The patient's family history includes Heart disease in her maternal aunt, mother, and paternal aunt; Hypertension in her mother; Lung cancer in her father. ROS:   Please see the history of present illness.    All other systems reviewed and are negative.  EKGs/Labs/Other Studies Reviewed:    The following studies were reviewed today:    Recent Labs: No results found for requested labs within last 8760 hours.  Recent Lipid Panel No results found for: CHOL, TRIG, HDL, CHOLHDL, VLDL, LDLCALC, LDLDIRECT  Physical Exam:    VS:  BP 128/60 (BP Location: Right Arm, Patient Position: Sitting, Cuff Size: Normal)   Pulse (!) 56   Ht 5\' 4"  (1.626 m)   Wt 172 lb (78 kg)   LMP  (LMP Unknown)   SpO2 98%   BMI 29.52 kg/m     Wt Readings from Last 3 Encounters:  02/22/20 172 lb (78 kg)  10/18/19 175 lb (79.4 kg)  07/12/19 179 lb 6.4 oz (81.4 kg)     GEN: She has pallor skin of membranes well nourished, well developed in no acute distress HEENT: Normal NECK: No JVD; No carotid bruits LYMPHATICS: No lymphadenopathy CARDIAC: 3/6 holosystolic murmur left ear does not radiate to the carotids RRR, no murmurs, rubs, gallops RESPIRATORY:  Clear to auscultation without rales, wheezing or rhonchi  ABDOMEN: Soft,  non-tender, non-distended MUSCULOSKELETAL:  No edema; No deformity  SKIN: Warm and dry NEUROLOGIC:  Alert and oriented x 3 PSYCHIATRIC:  Normal affect    Signed, Shirlee More, MD  02/22/2020 9:14 AM    [place]

## 2020-02-22 ENCOUNTER — Encounter: Payer: Self-pay | Admitting: Cardiology

## 2020-02-22 ENCOUNTER — Ambulatory Visit: Payer: Medicare PPO | Admitting: Cardiology

## 2020-02-22 ENCOUNTER — Other Ambulatory Visit: Payer: Self-pay

## 2020-02-22 VITALS — BP 128/60 | HR 56 | Ht 64.0 in | Wt 172.0 lb

## 2020-02-22 DIAGNOSIS — E782 Mixed hyperlipidemia: Secondary | ICD-10-CM

## 2020-02-22 DIAGNOSIS — I5032 Chronic diastolic (congestive) heart failure: Secondary | ICD-10-CM | POA: Diagnosis not present

## 2020-02-22 DIAGNOSIS — N184 Chronic kidney disease, stage 4 (severe): Secondary | ICD-10-CM

## 2020-02-22 DIAGNOSIS — I350 Nonrheumatic aortic (valve) stenosis: Secondary | ICD-10-CM

## 2020-02-22 DIAGNOSIS — Z79899 Other long term (current) drug therapy: Secondary | ICD-10-CM | POA: Diagnosis not present

## 2020-02-22 DIAGNOSIS — I480 Paroxysmal atrial fibrillation: Secondary | ICD-10-CM | POA: Diagnosis not present

## 2020-02-22 DIAGNOSIS — I130 Hypertensive heart and chronic kidney disease with heart failure and stage 1 through stage 4 chronic kidney disease, or unspecified chronic kidney disease: Secondary | ICD-10-CM

## 2020-02-22 DIAGNOSIS — D631 Anemia in chronic kidney disease: Secondary | ICD-10-CM

## 2020-02-22 NOTE — Patient Instructions (Signed)

## 2020-08-14 ENCOUNTER — Other Ambulatory Visit: Payer: Self-pay

## 2020-08-14 ENCOUNTER — Ambulatory Visit (INDEPENDENT_AMBULATORY_CARE_PROVIDER_SITE_OTHER): Payer: Medicare PPO

## 2020-08-14 ENCOUNTER — Telehealth: Payer: Self-pay

## 2020-08-14 DIAGNOSIS — I5032 Chronic diastolic (congestive) heart failure: Secondary | ICD-10-CM

## 2020-08-14 LAB — ECHOCARDIOGRAM COMPLETE
AR max vel: 0.91 cm2
AV Area VTI: 0.71 cm2
AV Area mean vel: 0.84 cm2
AV Mean grad: 34 mmHg
AV Peak grad: 61.4 mmHg
Ao pk vel: 3.92 m/s
Area-P 1/2: 3.34 cm2
P 1/2 time: 606 msec
S' Lateral: 3.2 cm

## 2020-08-14 NOTE — Telephone Encounter (Signed)
-----   Message from Richardo Priest, MD sent at 08/14/2020  2:33 PM EST ----- Best described as unchanged and not quite severe I would hold on doing any intervention and I will discuss this with her when I see her the first week January

## 2020-08-14 NOTE — Progress Notes (Signed)
Complete echocardiogram performed.  Jimmy Romari Gasparro RDCS, RVT  

## 2020-08-14 NOTE — Telephone Encounter (Signed)
Spoke with patient regarding results and recommendation.  Patient verbalizes understanding and is agreeable to plan of care. Advised patient to call back with any issues or concerns.  

## 2020-09-06 ENCOUNTER — Other Ambulatory Visit: Payer: Self-pay

## 2020-09-06 ENCOUNTER — Encounter: Payer: Self-pay | Admitting: Cardiology

## 2020-09-06 ENCOUNTER — Ambulatory Visit: Payer: Medicare PPO | Admitting: Cardiology

## 2020-09-06 VITALS — BP 122/50 | HR 52 | Ht 64.0 in | Wt 170.4 lb

## 2020-09-06 DIAGNOSIS — I350 Nonrheumatic aortic (valve) stenosis: Secondary | ICD-10-CM

## 2020-09-06 DIAGNOSIS — Z79899 Other long term (current) drug therapy: Secondary | ICD-10-CM | POA: Diagnosis not present

## 2020-09-06 DIAGNOSIS — I480 Paroxysmal atrial fibrillation: Secondary | ICD-10-CM | POA: Diagnosis not present

## 2020-09-06 DIAGNOSIS — I130 Hypertensive heart and chronic kidney disease with heart failure and stage 1 through stage 4 chronic kidney disease, or unspecified chronic kidney disease: Secondary | ICD-10-CM | POA: Diagnosis not present

## 2020-09-06 DIAGNOSIS — N184 Chronic kidney disease, stage 4 (severe): Secondary | ICD-10-CM

## 2020-09-06 DIAGNOSIS — D631 Anemia in chronic kidney disease: Secondary | ICD-10-CM

## 2020-09-06 DIAGNOSIS — E782 Mixed hyperlipidemia: Secondary | ICD-10-CM

## 2020-09-06 NOTE — Progress Notes (Signed)
Cardiology Office Note:    Date:  09/06/2020   ID:  Sandy Hanson, DOB 1933-04-27, MRN [id number]  PCP:  Raina Mina., MD  Cardiologist:  Shirlee More, MD    Referring MD: Raina Mina., MD    ASSESSMENT:    1. Nonrheumatic aortic valve stenosis   2. Paroxysmal atrial fibrillation (HCC)   3. On amiodarone therapy   4. Hypertensive heart and chronic kidney disease with heart failure and stage 1 through stage 4 chronic kidney disease, or chronic kidney disease ([place])   5. Anemia due to stage 4 chronic kidney disease ([place])   6. Mixed hyperlipidemia    PLAN:    In order of problems listed above:  1. She has done remarkably well quality of life with the severity of her kidney disease stage V and heart disease aortic stenosis approaching severe.  This is a difficult case and that she has a good quality of life right now and I think that she would be a poor candidate for renal replacement therapy with her aortic stenosis and a poor candidate for surgical AVR or TAVR with her renal disease with a significant likelihood that when she would progress to renal replacement therapy.  At this time after discussion with the patient I think we should take a careful cautious approach pay attention to quality of her life continue current medical therapy and plan to recheck her echocardiogram in May and see me afterwards. 2. Stable maintaining sinus rhythm low-dose amiodarone not anticoagulated with previous major bleeding complication 3. Stable heart failure no fluid overload continue her loop diuretic and minimal dose of MRA tolerated without hyperkalemia she is seen and supervised by nephrology 4. Anemia is improved hemoglobin is normalized with iron and erythropoietin 5. Continue her statin   Next appointment: She will see me in May after her next echocardiogram   Medication Adjustments/Labs and Tests Ordered: Current medicines are reviewed at length with the patient today.  Concerns  regarding medicines are outlined above.  Orders Placed This Encounter  Procedures  . EKG 12-Lead  . ECHOCARDIOGRAM COMPLETE   No orders of the defined types were placed in this encounter.   Chief Complaint  Patient presents with  . Follow-up  . Aortic Stenosis    History of Present Illness:    Sandy Hanson is a 87 y.o. female with a hx of aortic stenosis paroxysmal atrial fibrillation with amiodarone therapy maintaining sinus rhythm and not anticoagulated because of previous major hemorrhagic complications mild mitral stenosis due to degenerative mitral valve disease heart failure with normal ejection fraction stage IV CKD and anemia with GI bleed requiring transfusion and endoscopy for cauterization of gastric bleeding site.  She was last seen 02/22/2020.  Compliance with diet, lifestyle and medications: Yes  Cardiogram performed 08/14/2020 showed ejection fraction 55 to 60% moderate concentric LVH and elevated left ventricular end-diastolic pressure and left atrial pressure.  She had posterior mitral annular calcification with mild regurgitation and no evidence of mitral stenosis moderate tricuspid regurgitation.  The aortic valve again was calcified with moderate to severe aortic stenosis mild aortic regurgitation mean gradient 34 mmHg.  Her stroke index was normal.  I am somewhat surprised she recently traveled to [place] and back without difficulty The quality of her life is good and she really has been improved with correction of her anemia. CKD is progressed to stage V She has had no edema shortness of breath chest pain palpitation or syncope and continues to maintain sinus rhythm  on amiodarone  Recent labs [facility] 08/07/2020: Creatinine 2.86 potassium 3.6 GFR 14 cc hemoglobin 11.5 Past Medical History:  Diagnosis Date  . Abnormal gait 10/17/2015   Last Assessment & Plan:  No recent fall, she has use of her cane and she has a supportive person with her typically  to help her   . Anemia 02/20/2015   Overview:  Recurrent, recently required 5 units of PRBC's  . Aortic stenosis, mild 02/20/2015  . Aortic valve insufficiency 02/20/2015   Overview:  Overview:  mild  . Arteriovenous malformation 10/17/2015  . Asthma with COPD ([place]) 10/17/2015  . Asthma, moderate persistent    Amb saturation: Normal 02/14/2014 PFTs>> Severe restriction and obstruction:  FeV1 34% FVC 44%  TLC 40%  Post BD FeV1 29% improvement HRCT Chest >>NORMAL  RA, Allergy panel all normal 02/21/2014 HFA technique 03/28/2014 20% efficiency improved to 75% with coaching,  spacer device added HFA technique 04/04/2014 less than 20% efficiency even with spacer device, the patient still tends to hold her breath for prolonged periods of time and does not take a deep breath in order to delivered drug into the lung   . Atrial fibrillation ([place]) 10/23/2014  . Cardiorenal syndrome with renal failure 03/07/2016  . Cardiorenal syndrome with renal failure 03/07/2016  . CHF (congestive heart failure) ([place]) 12/12/2014  . Chronic diastolic heart failure ([place]) 02/20/2015  . Chronic midline low back pain without sciatica 11/05/2015   Last Assessment & Plan:  Relevant Hx: Course: Daily Update: Today's Plan:she is as well following with ortho for her back and has a corset she is wearing for comfort and she does feel it helps her for right now but she is frustrated with her continued inability to not be able to have her balance back yet since she fell. She is not using her walker and is using her friend 's arm and have advised she consider using the walker as it makes her walk and be more responsible with her gait.  Electronically signed by: Mayer Camel, NP 01/02/16 2130  . Chronic respiratory failure ([place]) 08/14/2016  . Colon polyps 10/17/2015  . Community acquired pneumonia of left upper lobe of lung 08/05/2016  . Diverticulosis of colon 10/17/2015   Last Assessment & Plan:  She is eating fiber , she is not having bouts  of signficant constipation though she takes the miralax about every other day if she needs to  . Dyspnea   . Gout   . Hallux limitus 10/17/2015  . High risk medication use 10/17/2015  . Hypertension   . Hypomagnesemia 10/17/2015  . Major depressive disorder with current active episode 04/15/2016   Last Assessment & Plan:  Will start her on low dose of the zoloft at 25 mg and will see how she does with this and have her repeat her BMP in one mnth to ensure her renal function is stable from this  . Medication reaction 04/13/2014  . Mixed hyperlipidemia 10/17/2015   Last Assessment & Plan:  She will return fasting for her labs to be done to see how she is doing  . Nonrheumatic aortic valve insufficiency 02/20/2015   Overview:  mild  . Obesity 10/17/2015  . Obesity (BMI 30-39.9) 04/09/2016   Last Assessment & Plan:  She was advised to work on her diet and her walking if at all possible , be careful with this, but she can effectively change some of her diet from this  . OSA treated with BiPAP  10/21/2016  . Pleural effusion 10/17/2015  . Postmenopausal 04/15/2016   Last Assessment & Plan:  Obtain dexa for her she requests RH  . Primary insomnia 10/17/2015   Last Assessment & Plan:  She feels this is stable for her at this time and will follow  . PVC (premature ventricular contraction) 10/17/2015  . Restrictive lung disease 10/21/2016    Past Surgical History:  Procedure Laterality Date  . ABLATION    . CATARACT EXTRACTION    . double knee surgery    . TONSILLECTOMY    . VAGINAL HYSTERECTOMY      Current Medications: Current Meds  Medication Sig  . albuterol (VENTOLIN HFA) 108 (90 Base) MCG/ACT inhaler Inhale 2 puffs into the lungs every 6 (six) hours as needed.  Marland Kitchen amiodarone (PACERONE) 200 MG tablet Take 200 mg by mouth daily.  Marland Kitchen ascorbic acid (VITAMIN C) 500 MG tablet Take by mouth.  Marland Kitchen atorvastatin (LIPITOR) 20 MG tablet Take 20 mg by mouth daily.  . budesonide (PULMICORT) 0.25 MG/2ML nebulizer  solution INHALE 1 VIAL VIA NEBULZIER TWICE DAILY  . Cholecalciferol (VITAMIN D3) 1000 units CAPS Take 1,000 Units by mouth daily.  . febuxostat (ULORIC) 40 MG tablet Take 40 mg by mouth daily.  Marland Kitchen FERREX 150 150 MG capsule Take 150 mg by mouth daily.  . furosemide (LASIX) 40 MG tablet Take 40 mg by mouth 2 (two) times daily.  Marland Kitchen levothyroxine (SYNTHROID) 88 MCG tablet Take 88 mcg by mouth daily.  . montelukast (SINGULAIR) 10 MG tablet TAKE ONE TABLET EVERY DAY  . pantoprazole (PROTONIX) 40 MG tablet SMARTSIG:1 Tablet(s) By Mouth Morning-Night  . polyethylene glycol (MIRALAX / GLYCOLAX) packet Take 17 g by mouth daily as needed for mild constipation.  . potassium chloride (K-DUR,KLOR-CON) 10 MEQ tablet Take 1 tablet by mouth daily.  . sertraline (ZOLOFT) 50 MG tablet Take 50 mg by mouth daily.   Marland Kitchen spironolactone (ALDACTONE) 25 MG tablet Take 25 mg by mouth 3 (three) times a week. 0.5 tablet (12.5) Monday , Wednesday , Friday     Allergies:   Amoxicillin-pot clavulanate, Penicillins, and Wound dressing adhesive   Social History   Socioeconomic History  . Marital status: Divorced    Spouse name: Not on file  . Number of children: Not on file  . Years of education: Not on file  . Highest education level: Not on file  Occupational History  . Occupation: Retired    Comment: Pharmacist, hospital  Tobacco Use  . Smoking status: Never Smoker  . Smokeless tobacco: Never Used  Vaping Use  . Vaping Use: Never used  Substance and Sexual Activity  . Alcohol use: No  . Drug use: No  . Sexual activity: Not Currently  Other Topics Concern  . Not on file  Social History Narrative  . Not on file   Social Determinants of Health   Financial Resource Strain: Not on file  Food Insecurity: Not on file  Transportation Needs: Not on file  Physical Activity: Not on file  Stress: Not on file  Social Connections: Not on file     Family History: The patient's family history includes Heart disease in her  maternal aunt, mother, and paternal aunt; Hypertension in her mother; Lung cancer in her father. ROS:   Please see the history of present illness.    All other systems reviewed and are negative.  EKGs/Labs/Other Studies Reviewed:    The following studies were reviewed today:  EKG:  EKG ordered today and  personally reviewed.  The ekg ordered today demonstrates sinus rhythm first-degree AV block old anterior septal MI left axis deviation left anterior hemiblock.  EKG is read as right bundle branch block I disagree    Physical Exam:    VS:  BP (!) 122/50   Pulse (!) 52   Ht 5\' 4"  (1.626 m)   Wt 170 lb 6.4 oz (77.3 kg)   LMP  (LMP Unknown)   SpO2 96%   BMI 29.25 kg/m     Wt Readings from Last 3 Encounters:  09/06/20 170 lb 6.4 oz (77.3 kg)  02/22/20 172 lb (78 kg)  10/18/19 175 lb (79.4 kg)     GEN:  Well nourished, well developed in no acute distress HEENT: Normal NECK: No JVD; No carotid bruits LYMPHATICS: No lymphadenopathy CARDIAC: 3/6 holosystolic murmur AAS to the base of the right carotid encompasses S2 RRR, no murmurs, rubs, gallops RESPIRATORY:  Clear to auscultation without rales, wheezing or rhonchi  ABDOMEN: Soft, non-tender, non-distended MUSCULOSKELETAL:  No edema; No deformity  SKIN: Warm and dry NEUROLOGIC:  Alert and oriented x 3 PSYCHIATRIC:  Normal affect    Signed, Shirlee More, MD  09/06/2020 12:26 PM    [place]

## 2020-09-06 NOTE — Patient Instructions (Signed)
Medication Instructions:  Your physician recommends that you continue on your current medications as directed. Please refer to the Current Medication list given to you today.  *If you need a refill on your cardiac medications before your next appointment, please call your pharmacy*   Lab Work: None If you have labs (blood work) drawn today and your tests are completely normal, you will receive your results only by: Marland Kitchen MyChart Message (if you have MyChart) OR . A paper copy in the mail If you have any lab test that is abnormal or we need to change your treatment, we will call you to review the results.   Testing/Procedures: Your physician has requested that you have an echocardiogram. Echocardiography is a painless test that uses sound waves to create images of your heart. It provides your doctor with information about the size and shape of your heart and how well your heart's chambers and valves are working. This procedure takes approximately one hour. There are no restrictions for this procedure.     Follow-Up: At [facility], you and your health needs are our priority.  As part of our continuing mission to provide you with exceptional heart care, we have created designated Provider Care Teams.  These Care Teams include your primary Cardiologist (physician) and Advanced Practice Providers (APPs -  Physician Assistants and Nurse Practitioners) who all work together to provide you with the care you need, when you need it.  We recommend signing up for the patient portal called "MyChart".  Sign up information is provided on this After Visit Summary.  MyChart is used to connect with patients for Virtual Visits (Telemedicine).  Patients are able to view lab/test results, encounter notes, upcoming appointments, etc.  Non-urgent messages can be sent to your provider as well.   To learn more about what you can do with MyChart, go to [email or web address].    Your next appointment:   4  month(s)  The format for your next appointment:   In Person  Provider:   Shirlee More, MD   Other Instructions

## 2020-12-10 ENCOUNTER — Encounter: Payer: Self-pay | Admitting: Cardiology

## 2020-12-10 NOTE — Telephone Encounter (Signed)
Error

## 2021-01-09 ENCOUNTER — Other Ambulatory Visit: Payer: Medicare PPO

## 2021-01-14 DIAGNOSIS — I10 Essential (primary) hypertension: Secondary | ICD-10-CM | POA: Insufficient documentation

## 2021-01-14 DIAGNOSIS — R0600 Dyspnea, unspecified: Secondary | ICD-10-CM | POA: Insufficient documentation

## 2021-01-15 ENCOUNTER — Ambulatory Visit: Payer: Medicare PPO | Admitting: Cardiology

## 2021-01-17 ENCOUNTER — Other Ambulatory Visit: Payer: Self-pay

## 2021-01-17 ENCOUNTER — Ambulatory Visit (INDEPENDENT_AMBULATORY_CARE_PROVIDER_SITE_OTHER): Payer: Medicare PPO

## 2021-01-17 DIAGNOSIS — I350 Nonrheumatic aortic (valve) stenosis: Secondary | ICD-10-CM | POA: Diagnosis not present

## 2021-01-17 LAB — ECHOCARDIOGRAM COMPLETE
AR max vel: 0.89 cm2
AV Area VTI: 0.91 cm2
AV Area mean vel: 0.92 cm2
AV Mean grad: 32.8 mmHg
AV Peak grad: 60.1 mmHg
Ao pk vel: 3.88 m/s
Area-P 1/2: 2.54 cm2
MV VTI: 1.42 cm2
P 1/2 time: 486 msec
S' Lateral: 3.5 cm

## 2021-01-17 NOTE — Progress Notes (Signed)
Complete echocardiogram performed.  Jimmy Antinio Sanderfer RDCS, RVT  

## 2021-01-18 ENCOUNTER — Telehealth: Payer: Self-pay

## 2021-01-18 NOTE — Telephone Encounter (Signed)
Spoke with patient regarding results and recommendation.  Patient verbalizes understanding and is agreeable to plan of care. Advised patient to call back with any issues or concerns.  

## 2021-01-18 NOTE — Telephone Encounter (Signed)
-----   Message from Richardo Priest, MD sent at 01/18/2021  2:30 PM EDT ----- Stable aortic stenosis is moderate to severe unchanged we can review last year in the office to follow-up

## 2021-01-29 NOTE — Progress Notes (Signed)
Cardiology Office Note:    Date:  01/30/2021   ID:  Sandy Hanson, DOB [id number], MRN [id number]  PCP:  Raina Mina., MD  Cardiologist:  Shirlee More, MD    Referring MD: Raina Mina., MD    ASSESSMENT:    1. Nonrheumatic aortic valve stenosis   2. Paroxysmal atrial fibrillation (HCC)   3. On amiodarone therapy   4. Hypertensive heart and chronic kidney disease with heart failure and stage 1 through stage 4 chronic kidney disease, or chronic kidney disease ([place])   5. Mixed hyperlipidemia   6. Anemia due to stage 4 chronic kidney disease (HCC)   7. Stage 4 chronic kidney disease ([place])    PLAN:    In order of problems listed above:  1. She is doing remarkably well compensated heart failure in view of her comorbidities I am very reluctant to recommend TAVR and we will plan a repeat echocardiogram in about 6 months.  Right now she has a good quality of life. 2. Stable continue low-dose amiodarone 3. Recheck renal function heart failure is compensated continue current diuretics 4. She will continue her statin   Next appointment: 4 months   Medication Adjustments/Labs and Tests Ordered: Current medicines are reviewed at length with the patient today.  Concerns regarding medicines are outlined above.  Orders Placed This Encounter  Procedures  . Comprehensive metabolic panel  . TSH+T4F+T3Free  . EKG 12-Lead   No orders of the defined types were placed in this encounter.   Chief Complaint  Patient presents with  . Aortic Stenosis  . Atrial Fibrillation  . Congestive Heart Failure    History of Present Illness:    Sandy Hanson is a 87 y.o. female with a hx of severe aortic stenosis paroxysmal atrial fibrillation maintaining sinus rhythm on amiodarone and not anticoagulated because of previous major hemorrhagic complications mild mitral stenosis heart failure with normal ejection fraction CKD stage IV to stage V and anemia with previous GI bleed requiring  transfusion and endoscopy for cauterization at the gastric bleeding site last seen 09/06/2020.  Compliance with diet, lifestyle and medications: Yes  There on the way to the [facility]. Her weight is stable vital signs are good she has had no problems with edema shortness of breath chest pain palpitation or syncope. I reviewed the echo findings she does not have severe aortic stenosis yet I think she is very high risk for TAVR and will continue her current medical therapy which is a quarter a good quality of life.  Recent echocardiogram 01/17/2021 shows preserved EF 60 to 65% with mild concentric LVH severe left atrial enlargement mild to moderate mitral enlargement mild mitral valve regurgitation moderate to severe aortic stenosis mean gradient 38 mmHg VTI ratio 0.2 9 aortic valve area of 0.9 cm.  Recent labs [facility] 01/10/2021 Hemoglobin 9.1 TSH 9.55 potassium 4.4 creatinine 2.95 GFR 15 cc Past Medical History:  Diagnosis Date  . Abnormal gait 10/17/2015   Last Assessment & Plan:  No recent fall, she has use of her cane and she has a supportive person with her typically to help her   . Anemia 02/20/2015   Overview:  Recurrent, recently required 5 units of PRBC's  . Aortic stenosis, mild 02/20/2015  . Aortic valve insufficiency 02/20/2015   Overview:  Overview:  mild  . Arteriovenous malformation 10/17/2015  . Asthma with COPD ([place]) 10/17/2015  . Asthma, moderate persistent    Amb saturation: Normal 02/14/2014 PFTs>> Severe restriction  and obstruction:  FeV1 34% FVC 44%  TLC 40%  Post BD FeV1 29% improvement HRCT Chest >>NORMAL  RA, Allergy panel all normal 02/21/2014 HFA technique 03/28/2014 20% efficiency improved to 75% with coaching,  spacer device added HFA technique 04/04/2014 less than 20% efficiency even with spacer device, the patient still tends to hold her breath for prolonged periods of time and does not take a deep breath in order to delivered drug into the lung    . Atrial fibrillation ([place]) 10/23/2014  . Cardiorenal syndrome with renal failure 03/07/2016  . Cardiorenal syndrome with renal failure 03/07/2016  . CHF (congestive heart failure) ([place]) 12/12/2014  . Chronic diastolic heart failure ([place]) 02/20/2015  . Chronic midline low back pain without sciatica 11/05/2015   Last Assessment & Plan:  Relevant Hx: Course: Daily Update: Today's Plan:she is as well following with ortho for her back and has a corset she is wearing for comfort and she does feel it helps her for right now but she is frustrated with her continued inability to not be able to have her balance back yet since she fell. She is not using her walker and is using her friend 's arm and have advised she consider using the walker as it makes her walk and be more responsible with her gait.  Electronically signed by: Mayer Camel, NP 01/02/16 2130  . Chronic respiratory failure ([place]) 08/14/2016  . Colon polyps 10/17/2015  . Community acquired pneumonia of left upper lobe of lung 08/05/2016  . Diverticulosis of colon 10/17/2015   Last Assessment & Plan:  She is eating fiber , she is not having bouts of signficant constipation though she takes the miralax about every other day if she needs to  . Dyspnea   . Gout   . Hallux limitus 10/17/2015  . High risk medication use 10/17/2015  . Hypertension   . Hypomagnesemia 10/17/2015  . Major depressive disorder with current active episode 04/15/2016   Last Assessment & Plan:  Will start her on low dose of the zoloft at 25 mg and will see how she does with this and have her repeat her BMP in one mnth to ensure her renal function is stable from this  . Medication reaction 04/13/2014  . Mixed hyperlipidemia 10/17/2015   Last Assessment & Plan:  She will return fasting for her labs to be done to see how she is doing  . Nonrheumatic aortic valve insufficiency 02/20/2015   Overview:  mild  . Obesity 10/17/2015  . Obesity (BMI 30-39.9) 04/09/2016   Last  Assessment & Plan:  She was advised to work on her diet and her walking if at all possible , be careful with this, but she can effectively change some of her diet from this  . OSA treated with BiPAP 10/21/2016  . Pleural effusion 10/17/2015  . Postmenopausal 04/15/2016   Last Assessment & Plan:  Obtain dexa for her she requests RH  . Primary insomnia 10/17/2015   Last Assessment & Plan:  She feels this is stable for her at this time and will follow  . PVC (premature ventricular contraction) 10/17/2015  . Restrictive lung disease 10/21/2016    Past Surgical History:  Procedure Laterality Date  . ABLATION    . CATARACT EXTRACTION    . double knee surgery    . TONSILLECTOMY    . VAGINAL HYSTERECTOMY      Current Medications: Current Meds  Medication Sig  . albuterol (VENTOLIN HFA) 108 (90 Base)  MCG/ACT inhaler Inhale 2 puffs into the lungs every 6 (six) hours as needed.  Marland Kitchen amiodarone (PACERONE) 200 MG tablet Take 200 mg by mouth daily.  Marland Kitchen ascorbic acid (VITAMIN C) 500 MG tablet Take by mouth.  Marland Kitchen atorvastatin (LIPITOR) 20 MG tablet Take 20 mg by mouth daily.  . budesonide (PULMICORT) 0.25 MG/2ML nebulizer solution INHALE 1 VIAL VIA NEBULZIER TWICE DAILY  . Cholecalciferol (VITAMIN D3) 1000 units CAPS Take 1,000 Units by mouth daily.  . febuxostat (ULORIC) 40 MG tablet Take 40 mg by mouth daily.  Marland Kitchen FERREX 150 150 MG capsule Take 150 mg by mouth daily.  . furosemide (LASIX) 40 MG tablet Take 40 mg by mouth 2 (two) times daily.  Marland Kitchen levothyroxine (SYNTHROID) 88 MCG tablet Take 88 mcg by mouth daily.  . montelukast (SINGULAIR) 10 MG tablet TAKE ONE TABLET EVERY DAY  . pantoprazole (PROTONIX) 40 MG tablet SMARTSIG:1 Tablet(s) By Mouth Morning-Night  . polyethylene glycol (MIRALAX / GLYCOLAX) packet Take 17 g by mouth daily as needed for mild constipation.  . potassium chloride (K-DUR,KLOR-CON) 10 MEQ tablet Take 1 tablet by mouth daily.  . sertraline (ZOLOFT) 50 MG tablet Take 50 mg by mouth  daily.   Marland Kitchen spironolactone (ALDACTONE) 25 MG tablet Take 25 mg by mouth 3 (three) times a week. 0.5 tablet (12.5) Monday , Wednesday , Friday     Allergies:   Amoxicillin-pot clavulanate, Penicillins, and Wound dressing adhesive   Social History   Socioeconomic History  . Marital status: Divorced    Spouse name: Not on file  . Number of children: Not on file  . Years of education: Not on file  . Highest education level: Not on file  Occupational History  . Occupation: Retired    Comment: Pharmacist, hospital  Tobacco Use  . Smoking status: Never Smoker  . Smokeless tobacco: Never Used  Vaping Use  . Vaping Use: Never used  Substance and Sexual Activity  . Alcohol use: No  . Drug use: No  . Sexual activity: Not Currently  Other Topics Concern  . Not on file  Social History Narrative  . Not on file   Social Determinants of Health   Financial Resource Strain: Not on file  Food Insecurity: Not on file  Transportation Needs: Not on file  Physical Activity: Not on file  Stress: Not on file  Social Connections: Not on file     Family History: The patient's family history includes Heart disease in her maternal aunt, mother, and paternal aunt; Hypertension in her mother; Lung cancer in her father. ROS:   Please see the history of present illness.    All other systems reviewed and are negative.  EKGs/Labs/Other Studies Reviewed:    The following studies were reviewed today: EKG today shows sinus bradycardia 53 bpm first-degree AV block nonspecific T waves.  Physical Exam:    VS:  BP (!) 148/76 (BP Location: Right Arm, Patient Position: Sitting, Cuff Size: Normal)   Pulse (!) 53   Ht '5\' 4"'$  (1.626 m)   Wt 181 lb 3.2 oz (82.2 kg)   LMP  (LMP Unknown)   SpO2 97%   BMI 31.10 kg/m     Wt Readings from Last 3 Encounters:  01/30/21 181 lb 3.2 oz (82.2 kg)  09/06/20 170 lb 6.4 oz (77.3 kg)  02/22/20 172 lb (78 kg)     GEN:  Well nourished, well developed in no acute  distress HEENT: Normal NECK: No JVD; No carotid bruits LYMPHATICS: No  lymphadenopathy CARDIAC: Grade 3/6 to 4/6 harsh holosystolic murmur single S2 radiates to the carotids RRR, no murmurs, rubs, gallops RESPIRATORY:  Clear to auscultation without rales, wheezing or rhonchi  ABDOMEN: Soft, non-tender, non-distended MUSCULOSKELETAL:  No edema; No deformity  SKIN: Warm and dry NEUROLOGIC:  Alert and oriented x 3 PSYCHIATRIC:  Normal affect    Signed, Shirlee More, MD  01/30/2021 2:38 PM    [facility]

## 2021-01-30 ENCOUNTER — Ambulatory Visit (INDEPENDENT_AMBULATORY_CARE_PROVIDER_SITE_OTHER): Payer: Medicare PPO | Admitting: Cardiology

## 2021-01-30 ENCOUNTER — Encounter: Payer: Self-pay | Admitting: Cardiology

## 2021-01-30 ENCOUNTER — Other Ambulatory Visit: Payer: Self-pay

## 2021-01-30 VITALS — BP 148/76 | HR 53 | Ht 64.0 in | Wt 181.2 lb

## 2021-01-30 DIAGNOSIS — I350 Nonrheumatic aortic (valve) stenosis: Secondary | ICD-10-CM

## 2021-01-30 DIAGNOSIS — I130 Hypertensive heart and chronic kidney disease with heart failure and stage 1 through stage 4 chronic kidney disease, or unspecified chronic kidney disease: Secondary | ICD-10-CM | POA: Diagnosis not present

## 2021-01-30 DIAGNOSIS — Z79899 Other long term (current) drug therapy: Secondary | ICD-10-CM | POA: Diagnosis not present

## 2021-01-30 DIAGNOSIS — N184 Chronic kidney disease, stage 4 (severe): Secondary | ICD-10-CM

## 2021-01-30 DIAGNOSIS — I480 Paroxysmal atrial fibrillation: Secondary | ICD-10-CM

## 2021-01-30 DIAGNOSIS — D631 Anemia in chronic kidney disease: Secondary | ICD-10-CM

## 2021-01-30 DIAGNOSIS — E782 Mixed hyperlipidemia: Secondary | ICD-10-CM

## 2021-01-30 NOTE — Patient Instructions (Signed)
Medication Instructions:  Your physician recommends that you continue on your current medications as directed. Please refer to the Current Medication list given to you today.  *If you need a refill on your cardiac medications before your next appointment, please call your pharmacy*   Lab Work: Your physician recommends that you return for lab work in: TODAY TSH, T3, T4, CMP If you have labs (blood work) drawn today and your tests are completely normal, you will receive your results only by: Marland Kitchen MyChart Message (if you have MyChart) OR . A paper copy in the mail If you have any lab test that is abnormal or we need to change your treatment, we will call you to review the results.   Testing/Procedures: None   Follow-Up: At [facility], you and your health needs are our priority.  As part of our continuing mission to provide you with exceptional heart care, we have created designated Provider Care Teams.  These Care Teams include your primary Cardiologist (physician) and Advanced Practice Providers (APPs -  Physician Assistants and Nurse Practitioners) who all work together to provide you with the care you need, when you need it.  We recommend signing up for the patient portal called "MyChart".  Sign up information is provided on this After Visit Summary.  MyChart is used to connect with patients for Virtual Visits (Telemedicine).  Patients are able to view lab/test results, encounter notes, upcoming appointments, etc.  Non-urgent messages can be sent to your provider as well.   To learn more about what you can do with MyChart, go to [email or web address].    Your next appointment:   4 month(s)  The format for your next appointment:   In Person  Provider:   Shirlee More, MD   Other Instructions

## 2021-01-31 ENCOUNTER — Telehealth: Payer: Self-pay

## 2021-01-31 LAB — COMPREHENSIVE METABOLIC PANEL
ALT: 16 IU/L (ref 0–32)
AST: 25 IU/L (ref 0–40)
Albumin/Globulin Ratio: 1.2 (ref 1.2–2.2)
Albumin: 3.8 g/dL (ref 3.6–4.6)
Alkaline Phosphatase: 66 IU/L (ref 44–121)
BUN/Creatinine Ratio: 24 (ref 12–28)
BUN: 77 mg/dL (ref 8–27)
Bilirubin Total: 0.3 mg/dL (ref 0.0–1.2)
CO2: 17 mmol/L — ABNORMAL LOW (ref 20–29)
Calcium: 8.9 mg/dL (ref 8.7–10.3)
Chloride: 106 mmol/L (ref 96–106)
Creatinine, Ser: 3.25 mg/dL — ABNORMAL HIGH (ref 0.57–1.00)
Globulin, Total: 3.3 g/dL (ref 1.5–4.5)
Glucose: 95 mg/dL (ref 65–99)
Potassium: 4.2 mmol/L (ref 3.5–5.2)
Sodium: 141 mmol/L (ref 134–144)
Total Protein: 7.1 g/dL (ref 6.0–8.5)
eGFR: 13 mL/min/{1.73_m2} — ABNORMAL LOW (ref >59)

## 2021-01-31 LAB — TSH+T4F+T3FREE
Free T4: 1.67 ng/dL (ref 0.82–1.77)
T3, Free: 1.4 pg/mL — ABNORMAL LOW (ref 2.0–4.4)
TSH: 6.24 u[IU]/mL — ABNORMAL HIGH (ref 0.450–4.500)

## 2021-01-31 NOTE — Telephone Encounter (Signed)
Left message on patients voicemail to please return our call.   

## 2021-01-31 NOTE — Telephone Encounter (Signed)
-----   Message from Richardo Priest, MD sent at 01/31/2021  7:32 AM EDT ----- Renal function is somewhat worse than the last that was done in March.  I think she should stop taking spironolactone.  Remainder are stable.

## 2021-02-01 ENCOUNTER — Telehealth: Payer: Self-pay

## 2021-02-01 NOTE — Telephone Encounter (Signed)
Left message on patients voicemail to please return our call.   

## 2021-02-01 NOTE — Telephone Encounter (Signed)
-----   Message from Richardo Priest, MD sent at 01/31/2021  7:32 AM EDT ----- Renal function is somewhat worse than the last that was done in March.  I think she should stop taking spironolactone.  Remainder are stable.

## 2021-02-04 ENCOUNTER — Telehealth: Payer: Self-pay

## 2021-02-04 NOTE — Telephone Encounter (Signed)
Left message on patients voicemail to please return our call.   Letter has been sent to the patient at this time after trying to reach her x3 with no success.

## 2021-02-04 NOTE — Telephone Encounter (Signed)
-----   Message from Richardo Priest, MD sent at 01/31/2021  7:32 AM EDT ----- Renal function is somewhat worse than the last that was done in March.  I think she should stop taking spironolactone.  Remainder are stable.

## 2021-02-05 NOTE — Addendum Note (Signed)
Addended by: Truddie Hidden on: 02/05/2021 03:28 PM   Modules accepted: Orders

## 2021-02-05 NOTE — Telephone Encounter (Signed)
Patient is returning call.  °

## 2021-02-05 NOTE — Telephone Encounter (Signed)
Results reviewed with pt as per Dr. Munley's note.  Pt verbalized understanding and had no additional questions. Routed to PCP  

## 2021-05-29 ENCOUNTER — Encounter: Payer: Self-pay | Admitting: Cardiology

## 2021-05-29 ENCOUNTER — Other Ambulatory Visit: Payer: Self-pay

## 2021-05-29 ENCOUNTER — Ambulatory Visit: Payer: Medicare PPO | Admitting: Cardiology

## 2021-05-29 VITALS — BP 150/50 | HR 51 | Ht 64.0 in | Wt 178.2 lb

## 2021-05-29 DIAGNOSIS — I130 Hypertensive heart and chronic kidney disease with heart failure and stage 1 through stage 4 chronic kidney disease, or unspecified chronic kidney disease: Secondary | ICD-10-CM

## 2021-05-29 DIAGNOSIS — I342 Nonrheumatic mitral (valve) stenosis: Secondary | ICD-10-CM

## 2021-05-29 DIAGNOSIS — Z79899 Other long term (current) drug therapy: Secondary | ICD-10-CM | POA: Diagnosis not present

## 2021-05-29 DIAGNOSIS — I480 Paroxysmal atrial fibrillation: Secondary | ICD-10-CM

## 2021-05-29 DIAGNOSIS — M60819 Other myositis, unspecified shoulder: Secondary | ICD-10-CM

## 2021-05-29 DIAGNOSIS — E782 Mixed hyperlipidemia: Secondary | ICD-10-CM

## 2021-05-29 DIAGNOSIS — I350 Nonrheumatic aortic (valve) stenosis: Secondary | ICD-10-CM | POA: Diagnosis not present

## 2021-05-29 NOTE — Patient Instructions (Signed)

## 2021-05-29 NOTE — Progress Notes (Signed)
Cardiology Office Note:    Date:  05/29/2021   ID:  Sandy Hanson, DOB [birth year], MRN [id number]  PCP:  Raina Mina., MD  Cardiologist:  Shirlee More, MD    Referring MD: Raina Mina., MD    ASSESSMENT:    1. Paroxysmal atrial fibrillation (HCC)   2. On amiodarone therapy   3. Nonrheumatic aortic valve stenosis   4. Nonrheumatic mitral valve stenosis   5. Hypertensive heart and chronic kidney disease with heart failure and stage 1 through stage 4 chronic kidney disease, or chronic kidney disease ([place])   6. Mixed hyperlipidemia   7. Other myositis of shoulder, unspecified laterality    PLAN:    In order of problems listed above:  Overall she has done well she maintained sinus rhythm on low-dose amiodarone without toxicity labs are predominantly followed in her PCP office.  She is on stable thyroid supplement.  She is not anticoagulated with any major hemorrhagic complications in the past Her aortic stenosis clinically is becoming severe I think she is a very poor candidate for TAVR at this time I will hold off doing further echocardiograms quality of life is good mitral valve disease is stable Heart failure is compensated she has meticulously managed at home we will continue her current loop diuretic and I stopped MRA with her worsened kidney disease blood pressure at target Discontinue statin   Next appointment: 3 months   Medication Adjustments/Labs and Tests Ordered: Current medicines are reviewed at length with the patient today.  Concerns regarding medicines are outlined above.  Orders Placed This Encounter  Procedures   EKG 12-Lead    No orders of the defined types were placed in this encounter.   Chief Complaint  Patient presents with   Follow-up   Atrial Fibrillation   Congestive Heart Failure     History of Present Illness:    Sandy Hanson is a 87 y.o. 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  Compliance with diet, lifestyle and medications: Yes  Overall she is doing well weight stable heart rate runs 50 to 60 bpm blood pressure typically [id number] systolic She is not having edema shortness of breath chest pain palpitation or syncope. 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  If not improved she may need further evaluation for polymyalgia rheumatica She had COVID-19 in July and is still having weakness.  She was not admitted to the hospital and she plans to get boosted.  Recent echocardiogram 01/17/2021 shows preserved EF 60 to 65% with mild concentric LVH severe left atrial enlargement mild to moderate mitral enlargement mild mitral valve regurgitation moderate to severe aortic stenosis mean gradient 38 mmHg VTI ratio 0.2 9 aortic valve area of 0.9 cm.   She continues to follow with [facility] nutrition pulmonary and nephrology on BiPAP for sleep apnea and receiving Epogen for anemia related to her chronic kidney disease Past Medical History:  Diagnosis Date   Abnormal gait 10/17/2015   Last Assessment & Plan:  No recent fall, she has use of her cane and she has  a supportive person with her typically to help her    Anemia 02/20/2015   Overview:  Recurrent, recently required 5 units of PRBC's   Aortic stenosis, mild 02/20/2015   Aortic valve insufficiency 02/20/2015   Overview:  Overview:  mild   Arteriovenous malformation 10/17/2015   Asthma with COPD ([place]) 10/17/2015   Asthma, moderate persistent    Amb saturation: Normal 02/14/2014 PFTs>> Severe  restriction and obstruction:  FeV1 34% FVC 44%  TLC 40%  Post BD FeV1 29% improvement HRCT Chest >>NORMAL  RA, Allergy panel all normal 02/21/2014 HFA technique 03/28/2014 20% efficiency improved to 75% with coaching,  spacer device added HFA technique 04/04/2014 less than 20% efficiency even with spacer device, the patient still tends to hold her breath for prolonged periods of time and does not take a deep breath in order to delivered drug into the lung    Atrial fibrillation ([place]) 10/23/2014   Cardiorenal syndrome with renal failure 03/07/2016   Cardiorenal syndrome with renal failure 03/07/2016   CHF (congestive heart failure) ([place]) 12/12/2014   Chronic diastolic heart failure ([place]) 02/20/2015   Chronic midline low back pain without sciatica 11/05/2015   Last Assessment & Plan:  Relevant Hx: Course: Daily Update: Today's Plan:she is as well following with ortho for her back and has a corset she is wearing for comfort and she does feel it helps her for right now but she is frustrated with her continued inability to not be able to have her balance back yet since she fell. She is not using her walker and is using her friend 's arm and have advised she consider using the walker as it makes her walk and be more responsible with her gait.  Electronically signed by: Mayer Camel, NP 01/02/16 2130   Chronic respiratory failure ([place]) 08/14/2016   Colon polyps 10/17/2015   Community acquired pneumonia of left upper lobe of lung 08/05/2016   Diverticulosis of colon 10/17/2015   Last Assessment & Plan:  She is eating fiber , she is not having bouts of signficant constipation though she takes the miralax about every other day if she needs to   Dyspnea    Gout    Hallux limitus 10/17/2015   High risk medication use 10/17/2015   Hypertension    Hypomagnesemia 10/17/2015   Major depressive disorder with current active episode 04/15/2016   Last Assessment & Plan:  Will start her on low dose of the zoloft at 25 mg  and will see how she does with this and have her repeat her BMP in one mnth to ensure her renal function is stable from this   Medication reaction 04/13/2014   Mixed hyperlipidemia 10/17/2015   Last Assessment & Plan:  She will return fasting for her labs to be done to see how she is doing   Nonrheumatic aortic valve insufficiency 02/20/2015   Overview:  mild   Obesity 10/17/2015   Obesity (BMI 30-39.9) 04/09/2016   Last Assessment & Plan:  She was advised to work on her diet and her walking if at all possible , be careful with this, but she can effectively change some of her diet from this   OSA treated with BiPAP 10/21/2016   Pleural effusion 10/17/2015   Postmenopausal 04/15/2016   Last Assessment & Plan:  Obtain dexa for her she requests RH   Primary insomnia 10/17/2015   Last Assessment & Plan:  She feels this is stable for her at this time and will  follow   PVC (premature ventricular contraction) 10/17/2015   Restrictive lung disease 10/21/2016    Past Surgical History:  Procedure Laterality Date   ABLATION     CATARACT EXTRACTION     double knee surgery     TONSILLECTOMY     VAGINAL HYSTERECTOMY      Current Medications: Current Meds  Medication Sig   albuterol (VENTOLIN HFA) 108 (90 Base) MCG/ACT inhaler Inhale 2 puffs into the lungs every 6 (six) hours as needed.   amiodarone (PACERONE) 200 MG tablet Take 200 mg by mouth daily.   ascorbic acid (VITAMIN C) 500 MG tablet Take by mouth.   atorvastatin (LIPITOR) 20 MG tablet Take 20 mg by mouth daily.   budesonide (PULMICORT) 0.25 MG/2ML nebulizer solution INHALE 1 VIAL VIA NEBULZIER TWICE DAILY   Cholecalciferol (VITAMIN D3) 1000 units CAPS Take 1,000 Units by mouth daily.   febuxostat (ULORIC) 40 MG tablet Take 40 mg by mouth daily.   FERREX 150 150 MG capsule Take 150 mg by mouth daily.   furosemide (LASIX) 40 MG tablet Take 40 mg by mouth 2 (two) times daily.   levothyroxine (SYNTHROID) 112 MCG tablet Take 112 mcg by mouth daily.    montelukast (SINGULAIR) 10 MG tablet TAKE ONE TABLET EVERY DAY   pantoprazole (PROTONIX) 40 MG tablet SMARTSIG:1 Tablet(s) By Mouth Morning-Night   polyethylene glycol (MIRALAX / GLYCOLAX) packet Take 17 g by mouth daily as needed for mild constipation.   potassium chloride (K-DUR,KLOR-CON) 10 MEQ tablet Take 1 tablet by mouth daily.   sertraline (ZOLOFT) 50 MG tablet Take 50 mg by mouth daily.    traMADol (ULTRAM) 50 MG tablet Take 50 mg by mouth every 6 (six) hours as needed for moderate pain or severe pain.     Allergies:   Amoxicillin-pot clavulanate, Penicillins, and Wound dressing adhesive   Social History   Socioeconomic History   Marital status: Divorced    Spouse name: Not on file   Number of children: Not on file   Years of education: Not on file   Highest education level: Not on file  Occupational History   Occupation: Retired    Comment: Pharmacist, hospital  Tobacco Use   Smoking status: Never   Smokeless tobacco: Never  Vaping Use   Vaping Use: Never used  Substance and Sexual Activity   Alcohol use: No   Drug use: No   Sexual activity: Not Currently  Other Topics Concern   Not on file  Social History Narrative   Not on file   Social Determinants of Health   Financial Resource Strain: Not on file  Food Insecurity: Not on file  Transportation Needs: Not on file  Physical Activity: Not on file  Stress: Not on file  Social Connections: Not on file     Family History: The patient's family history includes Heart disease in her maternal aunt, mother, and paternal aunt; Hypertension in her mother; Lung cancer in her father. ROS:   Please see the history of present illness.    All other systems reviewed and are negative.  EKGs/Labs/Other Studies Reviewed:    The following studies were reviewed today:  EKG:  EKG ordered today and personally reviewed.  The ekg ordered today demonstrates sinus bradycardia 51 beats Old anterior septal MI left axis deviation left  anterior hemiblock  Recent Labs: 01/30/2021: ALT 16; BUN 77; Creatinine, Ser 3.25; Potassium 4.2; Sodium 141; TSH 6.240  05/14/2021 sodium 139 potassium 4.3 creatinine 2.92 GFR 15 cc TSH  11.34 Hemoglobin 9.5 Physical Exam:    VS:  BP (!) 150/50 (BP Location: Left Arm, Patient Position: Sitting)   Pulse (!) 51   Ht '5\' 4"'$  (1.626 m)   Wt 178 lb 3.2 oz (80.8 kg)   LMP  (LMP Unknown)   SpO2 98%   BMI 30.59 kg/m     Wt Readings from Last 3 Encounters:  05/29/21 178 lb 3.2 oz (80.8 kg)  01/30/21 181 lb 3.2 oz (82.2 kg)  09/06/20 170 lb 6.4 oz (77.3 kg)     GEN: She looks her age well nourished, well developed in no acute distress HEENT: Normal NECK: No JVD; No carotid bruits LYMPHATICS: No lymphadenopathy CARDIAC: Grade 2/6 to 3/6 RRR, no  rubs, gallops RESPIRATORY:  Clear to auscultation without rales, wheezing or rhonchi  ABDOMEN: Soft, non-tender, non-distended MUSCULOSKELETAL:  No edema; No deformity  SKIN: Warm and dry NEUROLOGIC:  Alert and oriented x 3 PSYCHIATRIC:  Normal affect    Signed, Shirlee More, MD  05/29/2021 11:13 AM    [place]

## 2021-09-10 ENCOUNTER — Ambulatory Visit: Payer: Medicare PPO | Admitting: Cardiology

## 2021-09-10 ENCOUNTER — Encounter: Payer: Self-pay | Admitting: Cardiology

## 2021-09-10 ENCOUNTER — Other Ambulatory Visit: Payer: Self-pay

## 2021-09-10 VITALS — BP 124/62 | HR 56 | Ht 64.0 in | Wt 190.0 lb

## 2021-09-10 DIAGNOSIS — Z79899 Other long term (current) drug therapy: Secondary | ICD-10-CM

## 2021-09-10 DIAGNOSIS — I350 Nonrheumatic aortic (valve) stenosis: Secondary | ICD-10-CM

## 2021-09-10 DIAGNOSIS — I480 Paroxysmal atrial fibrillation: Secondary | ICD-10-CM | POA: Diagnosis not present

## 2021-09-10 DIAGNOSIS — I130 Hypertensive heart and chronic kidney disease with heart failure and stage 1 through stage 4 chronic kidney disease, or unspecified chronic kidney disease: Secondary | ICD-10-CM | POA: Diagnosis not present

## 2021-09-10 MED ORDER — FERREX 150 150 MG PO CAPS: 150.0000 mg | ORAL_CAPSULE | ORAL | Status: AC

## 2021-09-10 MED ORDER — FUROSEMIDE 40 MG PO TABS: 40.0000 mg | ORAL_TABLET | Freq: Three times a day (TID) | ORAL | 3 refills | Status: AC

## 2021-09-10 NOTE — Patient Instructions (Signed)
Medication Instructions:  Your physician has recommended you make the following change in your medication:   DECREASE Iron to every other day INCREASE Furosemide to 40 mg three times a day  *If you need a refill on your cardiac medications before your next appointment, please call your pharmacy*   Lab Work:  If you have labs (blood work) drawn today and your tests are completely normal, you will receive your results only by: [place] (if you have MyChart) OR A paper copy in the mail If you have any lab test that is abnormal or we need to change your treatment, we will call you to review the results.   Testing/Procedures:    Follow-Up: At [facility], you and your health needs are our priority.  As part of our continuing mission to provide you with exceptional heart care, we have created designated Provider Care Teams.  These Care Teams include your primary Cardiologist (physician) and Advanced Practice Providers (APPs -  Physician Assistants and Nurse Practitioners) who all work together to provide you with the care you need, when you need it.  We recommend signing up for the patient portal called "MyChart".  Sign up information is provided on this After Visit Summary.  MyChart is used to connect with patients for Virtual Visits (Telemedicine).  Patients are able to view lab/test results, encounter notes, upcoming appointments, etc.  Non-urgent messages can be sent to your provider as well.   To learn more about what you can do with MyChart, go to [email or web address].    Your next appointment:   3 month(s)  The format for your next appointment:   In Person  Provider:   Shirlee More, MD    Other Instructions

## 2021-09-10 NOTE — Progress Notes (Signed)
Cardiology Office Note:    Date:  09/10/2021   ID:  Sandy Hanson, DOB [birth year], MRN [id number]  PCP:  Raina Mina., MD  Cardiologist:  Shirlee More, MD    Referring MD: Raina Mina., MD    ASSESSMENT:    1. Paroxysmal atrial fibrillation (HCC)   2. On amiodarone therapy   3. Nonrheumatic aortic valve stenosis   4. Hypertensive heart and chronic kidney disease with heart failure and stage 1 through stage 4 chronic kidney disease, or chronic kidney disease (HCC)    PLAN:    In order of problems listed above:  Stable continue low-dose amiodarone not anticoagulated with previous GI hemorrhage At this time I would not recommend consideration of TAVR with severe CKD Heart failure is decompensated increase her loop diuretic, she is following with nephrology and is now on Procrit and iron for anemia related to her kidney disease   Next appointment: 3 months   Medication Adjustments/Labs and Tests Ordered: Current medicines are reviewed at length with the patient today.  Concerns regarding medicines are outlined above.  No orders of the defined types were placed in this encounter.  No orders of the defined types were placed in this encounter.   Chief Complaint  Patient presents with   Follow-up   Congestive Heart Failure   Atrial Fibrillation   Aortic Stenosis    History of Present Illness:    Sandy Hanson is a 88 y.o. 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  Her last echocardiogram 01/17/2021 showed EF 60 to 65% mild LVH severe left atrial enlargement and severe  aortic stenosis and mean gradient 38 mmHg and valve area 0.9 cm.    Compliance with diet, lifestyle and medications: Yes  Unfortunately steroids for PMR she developed increasing Taking an extra 20 mg of furosemide daily which is not making impact However take a total of 120 mg daily as her heart failure is decompensated she is mildly short of breath no chest pain orthopnea PND palpitation or syncope. She does receive Procrit and is now on iron therapy. Chart review shows patient continues to follow with nephrology [facility] last seen 08/12/2021) with 08/21/2021.  She was subsequently initiated on Procrit  Most recent labs 09/09/2021 hemoglobin 8.5 creatinine 2.99 GFR 15 cc potassium 4.2 sodium 143 Past Medical History:  Diagnosis Date   Abnormal gait 10/17/2015   Last Assessment & Plan:  No recent fall, she has use of her cane and she has a supportive person with her typically to help her    Anemia 02/20/2015   Overview:  Recurrent, recently required 5 units of PRBC's   Aortic stenosis, mild 02/20/2015   Aortic valve insufficiency 02/20/2015   Overview:  Overview:  mild   Arteriovenous malformation 10/17/2015   Asthma with COPD ([place]) 10/17/2015   Asthma, moderate persistent    Amb saturation: Normal 02/14/2014 PFTs>> Severe restriction and obstruction:  FeV1 34% FVC 44%  TLC 40%  Post BD FeV1 29% improvement HRCT Chest >>NORMAL  RA, Allergy panel all normal 02/21/2014 HFA technique 03/28/2014 20% efficiency improved to 75% with coaching,  spacer device added HFA technique 04/04/2014 less than 20%  efficiency even with spacer device, the patient still tends to hold her breath for prolonged periods of time and does not take a deep breath in order to delivered drug into the lung    Atrial fibrillation ([place]) 10/23/2014   Cardiorenal syndrome with renal failure 03/07/2016   Cardiorenal syndrome with renal failure 03/07/2016   CHF (congestive heart failure) ([place]) 12/12/2014   Chronic diastolic heart  failure ([place]) 02/20/2015   Chronic midline low back pain without sciatica 11/05/2015   Last Assessment & Plan:  Relevant Hx: Course: Daily Update: Today's Plan:she is as well following with ortho for her back and has a corset she is wearing for comfort and she does feel it helps her for right now but she is frustrated with her continued inability to not be able to have her balance back yet since she fell. She is not using her walker and is using her friend 's arm and have advised she consider using the walker as it makes her walk and be more responsible with her gait.  Electronically signed by: Mayer Camel, NP 01/02/16 2130   Chronic respiratory failure ([place]) 08/14/2016   Colon polyps 10/17/2015   Community acquired pneumonia of left upper lobe of lung 08/05/2016   Diverticulosis of colon 10/17/2015   Last Assessment & Plan:  She is eating fiber , she is not having bouts of signficant constipation though she takes the miralax about every other day if she needs to   Dyspnea    Gout    Hallux limitus 10/17/2015   High risk medication use 10/17/2015   Hypertension    Hypomagnesemia 10/17/2015   Major depressive disorder with current active episode 04/15/2016   Last Assessment & Plan:  Will start her on low dose of the zoloft at 25 mg and will see how she does with this and have her repeat her BMP in one mnth to ensure her renal function is stable from this   Medication reaction 04/13/2014   Mixed hyperlipidemia 10/17/2015   Last Assessment & Plan:  She will return fasting for her labs to be done to see how she is doing   Nonrheumatic aortic valve insufficiency 02/20/2015   Overview:  mild   Obesity 10/17/2015   Obesity (BMI 30-39.9) 04/09/2016   Last Assessment & Plan:  She was advised to work on her diet and her walking if at all possible , be careful with this, but she can effectively change some of her diet from this   OSA treated with BiPAP 10/21/2016   Pleural effusion 10/17/2015    Postmenopausal 04/15/2016   Last Assessment & Plan:  Obtain dexa for her she requests RH   Primary insomnia 10/17/2015   Last Assessment & Plan:  She feels this is stable for her at this time and will follow   PVC (premature ventricular contraction) 10/17/2015   Restrictive lung disease 10/21/2016    Past Surgical History:  Procedure Laterality Date   ABLATION     CATARACT EXTRACTION     double knee surgery     TONSILLECTOMY     VAGINAL HYSTERECTOMY      Current Medications: Current Meds  Medication Sig   albuterol (VENTOLIN HFA) 108 (90 Base) MCG/ACT inhaler Inhale 2 puffs into the lungs every 6 (six) hours as needed.   amiodarone (PACERONE) 200 MG tablet Take 200 mg by mouth daily.   ascorbic acid (VITAMIN C) 500 MG tablet Take by mouth.   budesonide (PULMICORT) 0.25 MG/2ML nebulizer  solution INHALE 1 VIAL VIA NEBULZIER TWICE DAILY   Cholecalciferol (VITAMIN D3) 1000 units CAPS Take 1,000 Units by mouth daily.   febuxostat (ULORIC) 40 MG tablet Take 40 mg by mouth daily.   FERREX 150 150 MG capsule Take 150 mg by mouth daily.   furosemide (LASIX) 40 MG tablet Take 40 mg by mouth 2 (two) times daily.   levothyroxine (SYNTHROID) 125 MCG tablet Take 125 mcg by mouth daily.   montelukast (SINGULAIR) 10 MG tablet TAKE ONE TABLET EVERY DAY   pantoprazole (PROTONIX) 40 MG tablet SMARTSIG:1 Tablet(s) By Mouth Morning-Night   polyethylene glycol (MIRALAX / GLYCOLAX) packet Take 17 g by mouth daily as needed for mild constipation.   potassium chloride (K-DUR,KLOR-CON) 10 MEQ tablet Take 1 tablet by mouth daily.   sertraline (ZOLOFT) 50 MG tablet Take 50 mg by mouth daily.    traMADol (ULTRAM) 50 MG tablet Take 50 mg by mouth every 6 (six) hours as needed for moderate pain or severe pain.     Allergies:   Amoxicillin-pot clavulanate, Penicillins, and Wound dressing adhesive   Social History   Socioeconomic History   Marital status: Divorced    Spouse name: Not on file   Number of  children: Not on file   Years of education: Not on file   Highest education level: Not on file  Occupational History   Occupation: Retired    Comment: Pharmacist, hospital  Tobacco Use   Smoking status: Never    Passive exposure: Never   Smokeless tobacco: Never  Vaping Use   Vaping Use: Never used  Substance and Sexual Activity   Alcohol use: No   Drug use: No   Sexual activity: Not Currently  Other Topics Concern   Not on file  Social History Narrative   Not on file   Social Determinants of Health   Financial Resource Strain: Not on file  Food Insecurity: Not on file  Transportation Needs: Not on file  Physical Activity: Not on file  Stress: Not on file  Social Connections: Not on file     Family History: The patient's family history includes Heart disease in her maternal aunt, mother, and paternal aunt; Hypertension in her mother; Lung cancer in her father. ROS:   Please see the history of present illness.    All other systems reviewed and are negative.  EKGs/Labs/Other Studies Reviewed:    The following studies were reviewed today:    Recent Labs: See history  Physical Exam:    VS:  BP 124/62 (BP Location: Left Arm)    Pulse (!) 56    Ht 5\' 4"  (1.626 m)    Wt 190 lb (86.2 kg)    LMP  (LMP Unknown)    SpO2 97%    BMI 32.61 kg/m     Wt Readings from Last 3 Encounters:  09/10/21 190 lb (86.2 kg)  05/29/21 178 lb 3.2 oz (80.8 kg)  01/30/21 181 lb 3.2 oz (82.2 kg)     GEN: She appears frail well nourished, well developed in no acute distress HEENT: Normal NECK: Mild JVD; No carotid bruits LYMPHATICS: No lymphadenopathy CARDIAC: Grade 3/6 holosystolic murmur AAS to the carotids reduced upstrokes single S2 RRR,  RESPIRATORY:  Clear to auscultation without rales, wheezing or rhonchi  ABDOMEN: Soft, non-tender, non-distended MUSCULOSKELETAL:  No 2-3+ ankle to knee bilateral pitting edema; No deformity  SKIN: Warm and dry NEUROLOGIC:  Alert and oriented x  3 PSYCHIATRIC:  Normal affect    Signed,  Shirlee More, MD  09/10/2021 1:22 PM    [facility]

## 2021-09-26 ENCOUNTER — Telehealth: Payer: Self-pay | Admitting: Cardiology

## 2021-09-26 NOTE — Telephone Encounter (Signed)
Spoke to the patients friend Caryl Asp just now per her DPR. She tells me that the patients furosemide was increased on 09/10/21 but the patient has still gained weight and her legs are very swollen. The patient states that her legs are painful even just to move them. The patient is also short of breath all of the time. Both when she is doing activities and when she is resting. They do not check her vitals at home but they checked them for me just now and they are listed below.   142/73 heart rate 92 bpm.   I discussed this with Dr. Bettina Gavia and he advised that she go to the ED at this time as well. I recommended [facility] at this time and they are going to head there now.    Encouraged patient to call back with any questions or concerns.

## 2021-09-26 NOTE — Telephone Encounter (Signed)
Pt c/o swelling: STAT is pt has developed SOB within 24 hours  How much weight have you gained and in what time span? Yes unsure of how much  If swelling, where is the swelling located? In both legs   Are you currently taking a fluid pill? yes  Are you currently SOB? Yes for a week now.  Do you have a log of your daily weights (if so, list)?    Have you gained 3 pounds in a day or 5 pounds in a week? Unsure   Have you traveled recently? no

## 2021-09-30 ENCOUNTER — Telehealth: Payer: Self-pay | Admitting: Cardiology

## 2021-09-30 NOTE — Telephone Encounter (Signed)
Advised that Dr. Bettina Gavia is off today and will not return until tomorrow. Judson Roch states that the pt only is requesting Dr. Joya Gaskins opinion of treating her blood clot. Judson Roch states that the pt said in the past that the pt is not a candidate for blood thinners. Pt is currently admitted in the hospital for same. Will route to Dr. Bettina Gavia on 1/31.

## 2021-09-30 NOTE — Telephone Encounter (Signed)
Judson Roch, Hospitalist at [facility] called. She wanted to talk to Dr. Bettina Gavia about this patient

## 2021-11-26 ENCOUNTER — Telehealth: Payer: Self-pay | Admitting: Cardiology

## 2021-11-26 NOTE — Telephone Encounter (Signed)
? ?  Joy is calling, she wanted to send message to Dr. Bettina Gavia that pt passed away and she said if Dr. Bettina Gavia wanted to talk to her he can call her back ?

## 2021-11-30 DEATH — deceased

## 2021-12-10 ENCOUNTER — Ambulatory Visit: Payer: Medicare PPO | Admitting: Cardiology
